# Patient Record
Sex: Male | Born: 1970 | Race: White | Hispanic: No | Marital: Married | Smoking: Former smoker
Health system: Southern US, Community
[De-identification: ages and names within clinical notes are randomized; demographics above are authoritative.]

## PROBLEM LIST (undated history)

## (undated) DIAGNOSIS — M549 Dorsalgia, unspecified: Secondary | ICD-10-CM

## (undated) DIAGNOSIS — G8929 Other chronic pain: Secondary | ICD-10-CM

## (undated) DIAGNOSIS — T7840XA Allergy, unspecified, initial encounter: Secondary | ICD-10-CM

## (undated) DIAGNOSIS — Z8616 Personal history of COVID-19: Secondary | ICD-10-CM

## (undated) DIAGNOSIS — I639 Cerebral infarction, unspecified: Secondary | ICD-10-CM

## (undated) HISTORY — DX: Other chronic pain: G89.29

## (undated) HISTORY — DX: Dorsalgia, unspecified: M54.9

## (undated) HISTORY — DX: Cerebral infarction, unspecified: I63.9

## (undated) HISTORY — DX: Allergy, unspecified, initial encounter: T78.40XA

## (undated) HISTORY — DX: Personal history of COVID-19: Z86.16

## (undated) HISTORY — PX: WISDOM TOOTH EXTRACTION: SHX21

---

## 2009-08-30 ENCOUNTER — Ambulatory Visit: Payer: Self-pay | Admitting: Internal Medicine

## 2009-08-30 DIAGNOSIS — J309 Allergic rhinitis, unspecified: Secondary | ICD-10-CM | POA: Insufficient documentation

## 2009-08-30 DIAGNOSIS — M549 Dorsalgia, unspecified: Secondary | ICD-10-CM | POA: Insufficient documentation

## 2009-09-21 ENCOUNTER — Telehealth: Payer: Self-pay | Admitting: Internal Medicine

## 2010-02-18 ENCOUNTER — Ambulatory Visit
Admission: RE | Admit: 2010-02-18 | Discharge: 2010-02-18 | Payer: Self-pay | Source: Home / Self Care | Attending: Internal Medicine | Admitting: Internal Medicine

## 2010-02-18 ENCOUNTER — Encounter: Payer: Self-pay | Admitting: Internal Medicine

## 2010-02-18 DIAGNOSIS — I781 Nevus, non-neoplastic: Secondary | ICD-10-CM | POA: Insufficient documentation

## 2010-02-21 ENCOUNTER — Telehealth: Payer: Self-pay | Admitting: Internal Medicine

## 2010-03-12 NOTE — Progress Notes (Signed)
Summary: Meloxicam Refill  Phone Note Call from Patient Call back at [phone]   Caller: Patient Call For: D. Thomos Lemons DO Summary of Call: patient called and left voice message stating he spoke with Dr Artist Pais at his office visit about a rx for Meloxicam  for his back pain. He would like to know if he could get  a rx sent to [place] [facility] and [address]. Initial call taken by: Glendell Docker CMA,  September 21, 2009 11:24 AM  Follow-up for Phone Call        attempted to contact patient at [phone], no answer, voice message left informing patient rx sent to pharmacy.  He was advised to call if any questions Follow-up by: Glendell Docker CMA,  September 24, 2009 12:02 PM    New/Updated Medications: MOBIC 15 MG TABS (MELOXICAM) one by mouth once daily as needed for low back pain Prescriptions: MOBIC 15 MG TABS (MELOXICAM) one by mouth once daily as needed for low back pain  #30 x 2   Entered and Authorized by:   D. Thomos Lemons DO   Signed by:   D. Thomos Lemons DO on 09/24/2009   Method used:   Electronically to        [id number] (retail)       [address] [place], [place]  [address]       Ph: [phone]       Fax: [phone]   RxID:   [id number]
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
## 2010-03-14 NOTE — Assessment & Plan Note (Signed)
Summary: MOLE ON SHOULDER /MHF   Vital Signs:  Patient profile:   40 year old male Height:      70.5 inches Weight:      179.75 pounds BMI:     25.52 O2 Sat:      100 % on Room air Temp:     98.0 degrees F oral Pulse rate:   20 / minute Resp:     16 per minute BP sitting:   122 / 80  (right arm) Cuff size:   regular  Vitals Entered By: Glendell Docker CMA (February 18, 2010 10:43 AM)  O2 Flow:  Room air CC: Mole on  Is Patient Diabetic? No Pain Assessment Patient in pain? no      Comments evaluation of on mole on right shoulder, noticed Wednesday of last week   Primary Care Provider:  Dondra Spry DO  CC:  Mole on .  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 One by mouth once daily as needed for low back pain  Other Orders: Specimen Handling (44010)   Orders Added: 1)  Specimen Handling [99000] 2)  Est. Patient Level III [27253] 3)  Shave Skin Lesion < 0.5 cm/trunk/arm/leg [11300]   Immunization  History:  Influenza Immunization History:    Influenza:  historical (10/23/2009)   Immunization History:  Influenza Immunization History:    Influenza:  Historical (10/23/2009)  Current Allergies (reviewed today): No known allergies

## 2010-03-14 NOTE — Progress Notes (Signed)
Summary: pathology results  Phone Note Outgoing Call   Summary of Call: call pt - pathology showed both lesions benign nevus.  plz mail copy of path report to pt Initial call taken by: D. Thomos Lemons DO,  February 21, 2010 5:45 PM  Follow-up for Phone Call        call placed to patient at [phone], no answer. A detailed voice message was left informing patient per Dr Artist Pais instructions. Message was left for patient to call back if any questions. Copy of pathology report mailed to patient Follow-up by: Glendell Docker CMA,  February 22, 2010 2:09 PM

## 2010-03-14 NOTE — Miscellaneous (Signed)
Summary: Consent to Special Procedure  Consent to Special Procedure   Imported By: Lanelle Bal 02/26/2010 09:35:20  _____________________________________________________________________  External Attachment:    Type:   Image     Comment:   External Document

## 2010-08-22 ENCOUNTER — Encounter: Payer: Self-pay | Admitting: Internal Medicine

## 2010-08-29 ENCOUNTER — Ambulatory Visit (INDEPENDENT_AMBULATORY_CARE_PROVIDER_SITE_OTHER): Payer: BC Managed Care – PPO | Admitting: Internal Medicine

## 2010-08-29 ENCOUNTER — Encounter: Payer: Self-pay | Admitting: Internal Medicine

## 2010-08-29 DIAGNOSIS — Z131 Encounter for screening for diabetes mellitus: Secondary | ICD-10-CM

## 2010-08-29 DIAGNOSIS — M542 Cervicalgia: Secondary | ICD-10-CM | POA: Insufficient documentation

## 2010-08-29 DIAGNOSIS — Z13220 Encounter for screening for lipoid disorders: Secondary | ICD-10-CM

## 2010-08-29 LAB — LIPID PANEL
Cholesterol: 164 mg/dL (ref 0–200)
Triglycerides: 46 mg/dL (ref <150)

## 2010-08-29 NOTE — Assessment & Plan Note (Signed)
Improved. Continue conservative measures

## 2010-08-29 NOTE — Progress Notes (Signed)
  Subjective:    Patient ID: Chanson Teems, male    DOB: Dec 23, 1970, 40 y.o.   MRN: [id number]  HPI Pt presents to clinic for followup of neck pain. H/o chronic intermitent neck pain now improved with posture change and massage therapy. Denies radicular pain, paresthesias or weakness. Also needs lipid and glucose for work form. No other complaints.   Reviewed pmh, medications and allergies    Review of Systems  Musculoskeletal: Positive for arthralgias. Negative for back pain.       Objective:   Physical Exam  Nursing note and vitals reviewed. Constitutional: He appears well-developed and well-nourished. No distress.  HENT:  Head: Normocephalic and atraumatic.  Right Ear: External ear normal.  Left Ear: External ear normal.  Eyes: Conjunctivae are normal. No scleral icterus.  Neck: Neck supple. Carotid bruit is not present.  Cardiovascular: Normal rate and normal heart sounds.  Exam reveals no gallop and no friction rub.   No murmur heard. Pulmonary/Chest: Effort normal and breath sounds normal. No respiratory distress. He has no wheezes. He has no rales.  Neurological: He is alert.  Skin: Skin is warm and dry. He is not diaphoretic.  Psychiatric: He has a normal mood and affect.          Assessment & Plan:

## 2019-12-10 ENCOUNTER — Emergency Department (HOSPITAL_COMMUNITY): Payer: No Typology Code available for payment source

## 2019-12-10 ENCOUNTER — Other Ambulatory Visit: Payer: Self-pay

## 2019-12-10 ENCOUNTER — Encounter (HOSPITAL_COMMUNITY): Payer: Self-pay | Admitting: Emergency Medicine

## 2019-12-10 ENCOUNTER — Emergency Department (HOSPITAL_COMMUNITY)
Admission: EM | Admit: 2019-12-10 | Discharge: 2019-12-10 | Disposition: A | Discharge status: Home / Self Care | Payer: No Typology Code available for payment source | Attending: Emergency Medicine | Admitting: Emergency Medicine

## 2019-12-10 DIAGNOSIS — I629 Nontraumatic intracranial hemorrhage, unspecified: Secondary | ICD-10-CM | POA: Diagnosis not present

## 2019-12-10 DIAGNOSIS — R42 Dizziness and giddiness: Secondary | ICD-10-CM | POA: Insufficient documentation

## 2019-12-10 DIAGNOSIS — Z87891 Personal history of nicotine dependence: Secondary | ICD-10-CM | POA: Diagnosis not present

## 2019-12-10 DIAGNOSIS — D1800 Hemangioma unspecified site: Secondary | ICD-10-CM

## 2019-12-10 LAB — DIFFERENTIAL
Abs Immature Granulocytes: 0.04 10*3/uL (ref 0.00–0.07)
Basophils Absolute: 0 10*3/uL (ref 0.0–0.1)
Basophils Relative: 0 %
Eosinophils Absolute: 0 10*3/uL (ref 0.0–0.5)
Eosinophils Relative: 0 %
Immature Granulocytes: 0 %
Lymphocytes Relative: 6 %
Lymphs Abs: 0.6 10*3/uL — ABNORMAL LOW (ref 0.7–4.0)
Monocytes Absolute: 0.3 10*3/uL (ref 0.1–1.0)
Monocytes Relative: 3 %
Neutro Abs: 9.3 10*3/uL — ABNORMAL HIGH (ref 1.7–7.7)
Neutrophils Relative %: 91 %

## 2019-12-10 LAB — COMPREHENSIVE METABOLIC PANEL
ALT: 24 U/L (ref 0–44)
AST: 26 U/L (ref 15–41)
Albumin: 4.5 g/dL (ref 3.5–5.0)
Alkaline Phosphatase: 58 U/L (ref 38–126)
Anion gap: 13 (ref 5–15)
BUN: 14 mg/dL (ref 6–20)
CO2: 24 mmol/L (ref 22–32)
Calcium: 9.5 mg/dL (ref 8.9–10.3)
Chloride: 100 mmol/L (ref 98–111)
Creatinine, Ser: 0.81 mg/dL (ref 0.61–1.24)
GFR, Estimated: >60 mL/min (ref >60)
Glucose, Bld: 145 mg/dL — ABNORMAL HIGH (ref 70–99)
Potassium: 3.7 mmol/L (ref 3.5–5.1)
Sodium: 137 mmol/L (ref 135–145)
Total Bilirubin: 1 mg/dL (ref 0.3–1.2)
Total Protein: 7.4 g/dL (ref 6.5–8.1)

## 2019-12-10 LAB — PROTIME-INR
INR: 1.1 (ref 0.8–1.2)
Prothrombin Time: 14.2 seconds (ref 11.4–15.2)

## 2019-12-10 LAB — APTT: aPTT: 31 seconds (ref 24–36)

## 2019-12-10 LAB — CBC
HCT: 46.9 % (ref 39.0–52.0)
Hemoglobin: 16.1 g/dL (ref 13.0–17.0)
MCH: 28.5 pg (ref 26.0–34.0)
MCHC: 34.3 g/dL (ref 30.0–36.0)
MCV: 83.2 fL (ref 80.0–100.0)
Platelets: 248 10*3/uL (ref 150–400)
RBC: 5.64 MIL/uL (ref 4.22–5.81)
RDW: 13.2 % (ref 11.5–15.5)
WBC: 10.2 10*3/uL (ref 4.0–10.5)
nRBC: 0 % (ref 0.0–0.2)

## 2019-12-10 IMAGING — MR MR CERVICAL SPINE WO/W CM
6 of 8 series · 28 of 48 positions shown · IV contrast (gadavist)
Comparison: [DATE] head CT.

CLINICAL DATA: Abnormal CT finding.

Dizziness, nonspecific.
EXAM:
MRI HEAD WITHOUT AND WITH CONTRAST
MRI CERVICAL SPINE WITHOUT AND WITH CONTRAST
TECHNIQUE: Multiplanar, multiecho pulse sequences of the brain and surrounding
structures, and cervical spine, to include the craniocervical
junction and cervicothoracic junction, were obtained without and
with intravenous contrast.
CONTRAST:  8mL GADAVIST GADOBUTROL 1 MMOL/ML IV SOLN

[Series 22: T2 · sagittal · 3.0mm · 0.69mm/px · 3 of 15 slices shown (1 of 2)]
[im 1/15]
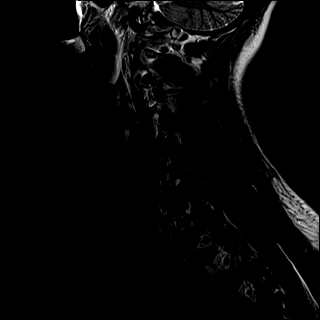
[im 8/15]
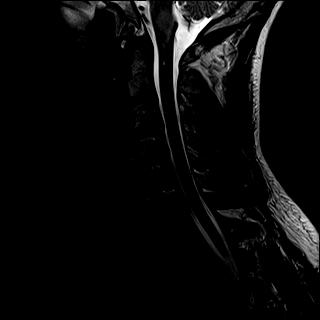
[im 15/15]
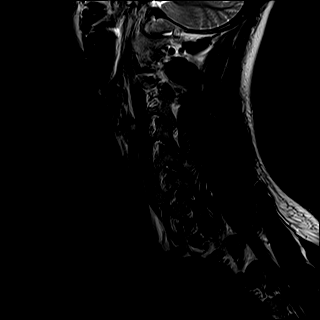

[Series 23: T1 · sagittal · 3.0mm · 0.69mm/px · 3 of 15 slices shown (1 of 2)]
[im 1/15]
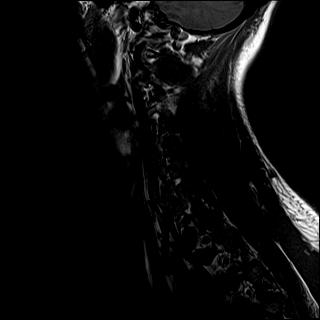
[im 8/15]
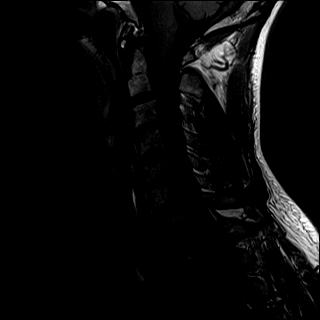
[im 15/15]
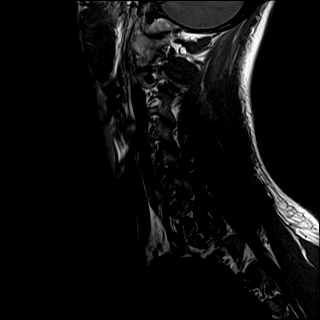

[Series 24: STIR · sagittal · 3.0mm · 0.86mm/px · 1 of 15 slices shown]
[im 1/15]
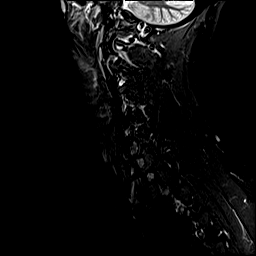

[Series 25: T2 · axial · 3.0mm · 0.66mm/px · z∈[-256,-141]mm · 9 of 37 slices shown (2 of 2)]
[im 1/37]
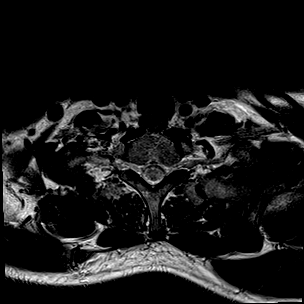
[im 5/37]
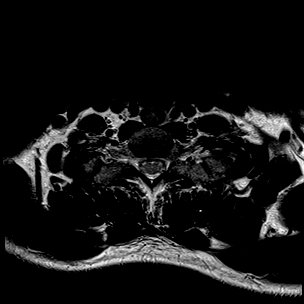
[im 10/37]
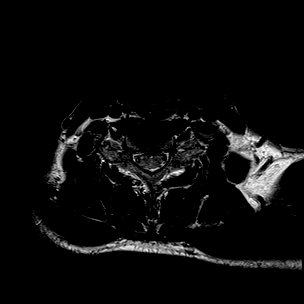
[im 14/37]
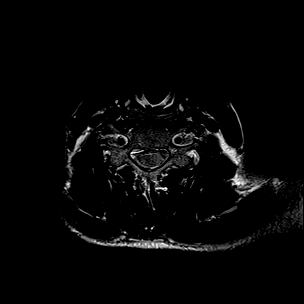
[im 19/37]
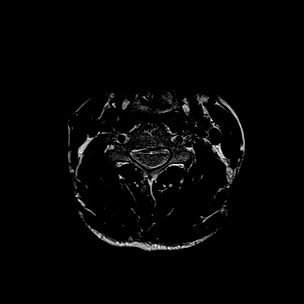
[im 23/37]
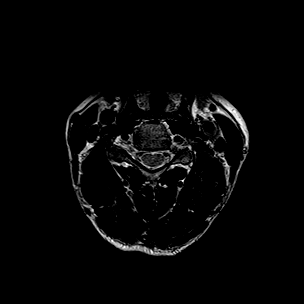
[im 28/37]
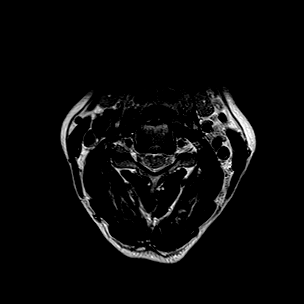
[im 32/37]
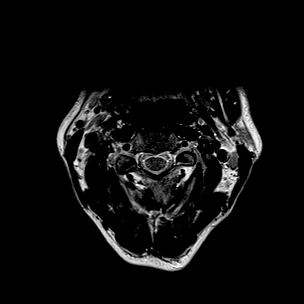
[im 37/37]
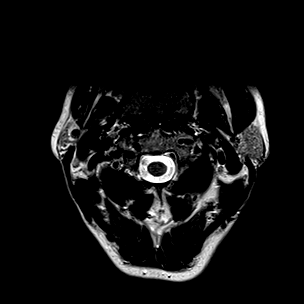

[Series 27: T1 · axial · 3.0mm · 0.39mm/px · z∈[-256,-141]mm · 9 of 37 slices shown (2 of 2)]
[im 1/37]
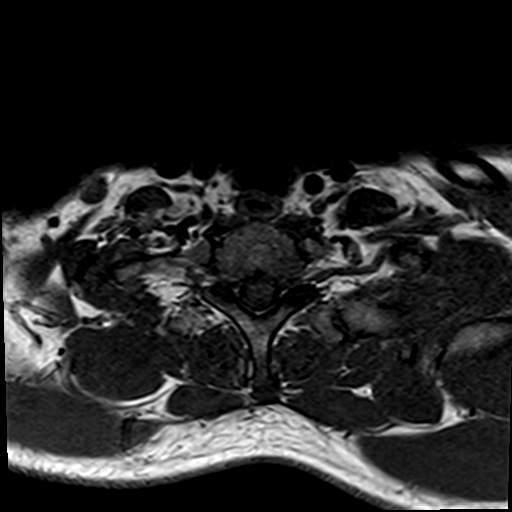
[im 5/37]
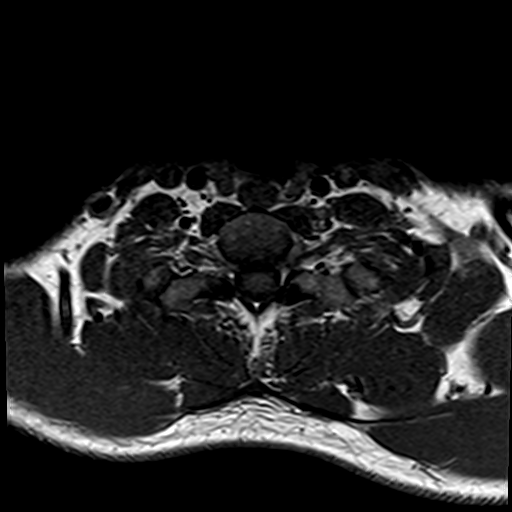
[im 10/37]
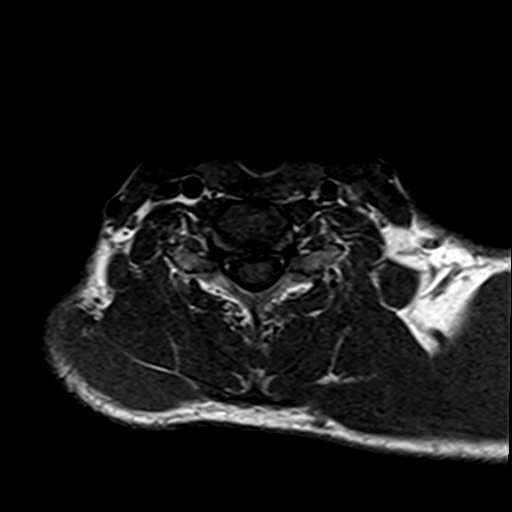
[im 14/37]
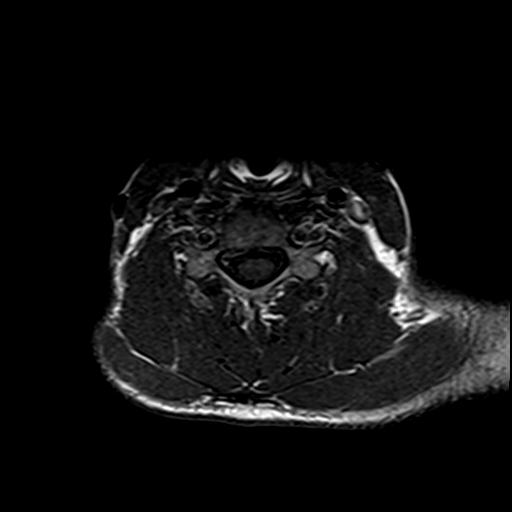
[im 19/37]
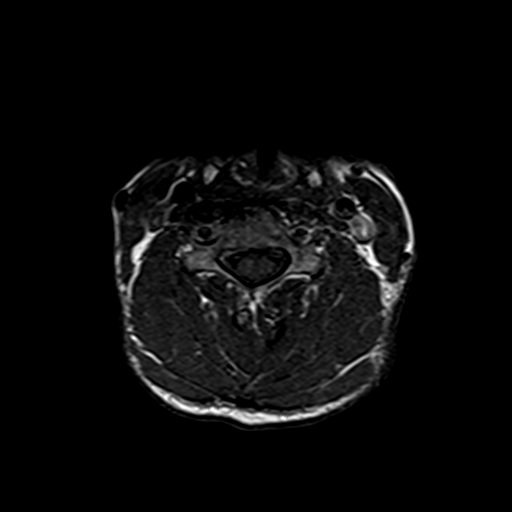
[im 23/37]
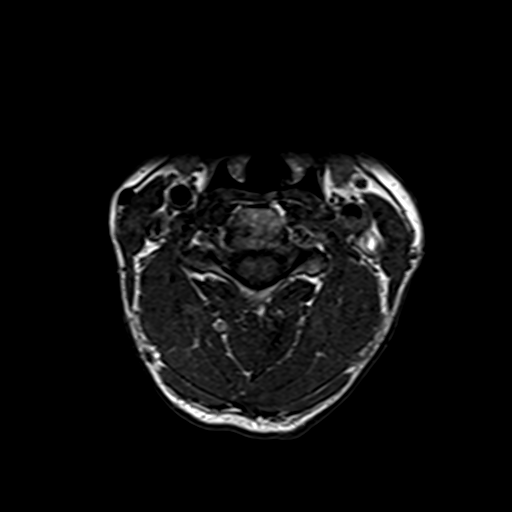
[im 28/37]
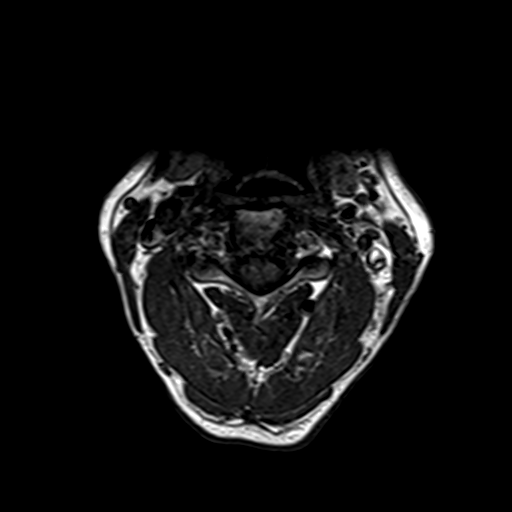
[im 32/37]
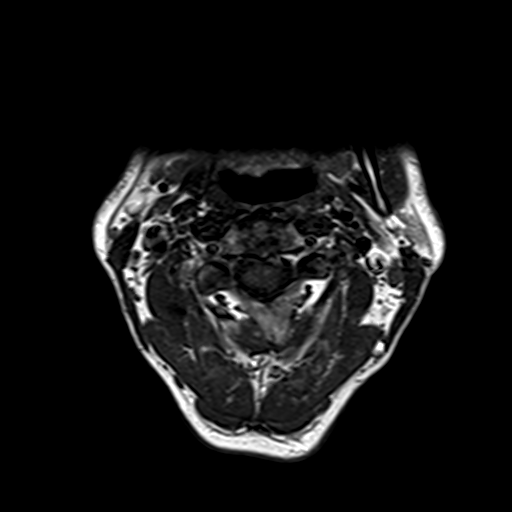
[im 37/37]
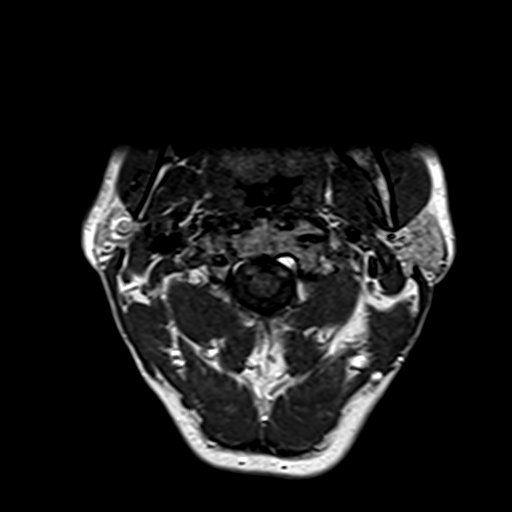

[Series 28: T1 fat-sat post-contrast · sagittal · 3.0mm · 0.43mm/px · 3 of 15 slices shown]
[im 1/15]
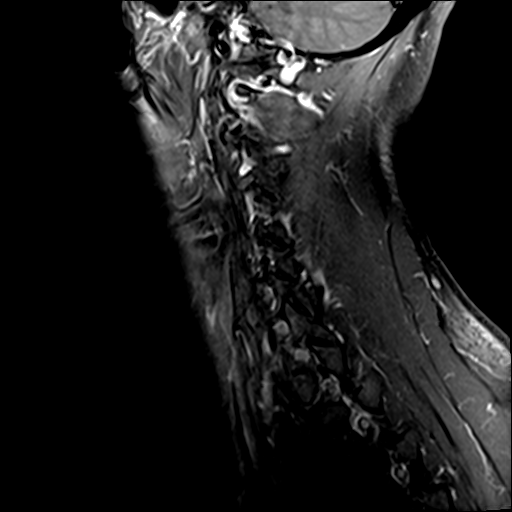
[im 8/15]
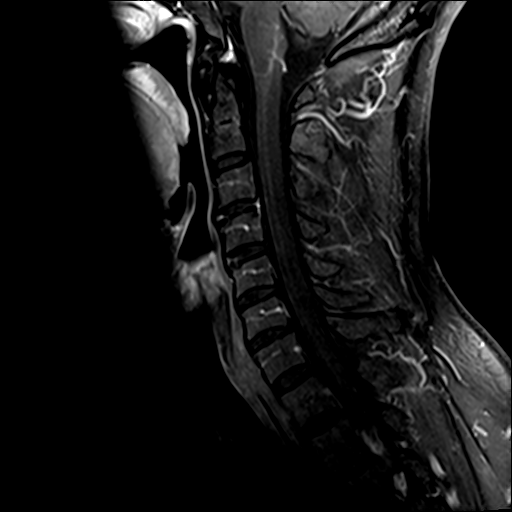
[im 15/15]
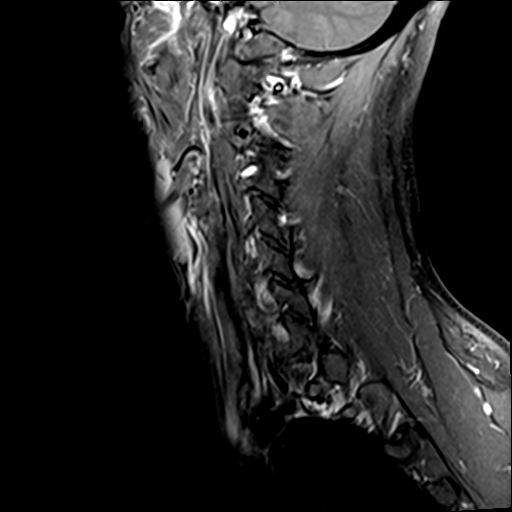

[28 of 48 positions shown; findings below may reference images not displayed]

FINDINGS: MRI HEAD FINDINGS

Brain: No focal restricted diffusion. No acute intracranial
hemorrhage. No midline shift, ventriculomegaly or extra-axial fluid
collection. No abnormal enhancement.

Nonenhancing 7 mm right medullary lesion with T2 hypointense rim
([DATE], [DATE]) and thin peripheral edema. There is associated SWI
signal dropout. Small associated developmental venous anomaly likely
within the left medulla/upper cervical cord ([DATE]).

Vascular: Proximally preserved major intracranial flow voids.

Skull and upper cervical spine: Normal marrow signal.

Sinuses/Orbits: Normal orbits. Minimal ethmoid and left maxillary
sinus mucosal thickening. Trace left mastoid effusion.

Other: None.

MRI CERVICAL SPINE FINDINGS

Alignment: Normal.

Vertebrae: Normal bone marrow signal intensity. No focal osseous
lesion.

Cord: Please see MRI head for evaluation of right medullary
cavernoma. Associated left upper cervical cord developmental venous
anomaly ([DATE]). Cord morphology and signal intensity is otherwise
normal.

Posterior Fossa, vertebral arteries: Please see MRI head.

Disc levels: Multilevel desiccation however disc spaces are grossly
preserved.

C2-3: Left uncovertebral and facet hypertrophy with mild left neural
foraminal narrowing. Patent spinal canal and right neural foramen.

C3-4: Small disc osteophyte complex with superimposed left
subarticular protrusion. Uncovertebral and facet hypertrophy. Mild
left neural foraminal narrowing. Patent spinal canal and right
neural foramen.

C4-5: Uncovertebral and facet degenerative spurring. Patent spinal
canal and neural foramen.

C5-6: Small disc osteophyte complex with superimposed bilateral
subarticular protrusions partially effacing the ventral CSF
containing spaces. Uncovertebral and facet degenerative spurring.
Patent spinal canal. Mild bilateral neural foraminal narrowing.

C6-7: Disc osteophyte complex partially effacing the ventral CSF
containing spaces. Small left foraminal protrusion. Uncovertebral
and facet hypertrophy. Minimal to mild spinal canal narrowing.
Moderate bilateral neural foraminal narrowing.

C7-T1: Uncovertebral and facet degenerative spurring. Patent spinal
canal and neural foramen.

Paraspinal tissues: Negative.
IMPRESSION: 7 mm right medullary cavernoma with small associated left
medulla/upper cervical cord developmental venous anomaly.

No acute intracranial hemorrhage or infarct.

Multilevel spondylosis as detailed above.

## 2019-12-10 IMAGING — CT CT HEAD W/O CM
4 series · 16 of 47 positions shown, 18 images · non-contrast
Comparison: None.

CLINICAL DATA: Dizziness.

EXAM:
CT HEAD WITHOUT CONTRAST
TECHNIQUE: Contiguous axial images were obtained from the base of the skull
through the vertex without intravenous contrast.

[Series 3: head wo · axial · 0.47mm/px · z∈[-135,-15]mm · 7 of 34 slices shown, 9 images]
[im 5/34  brain]
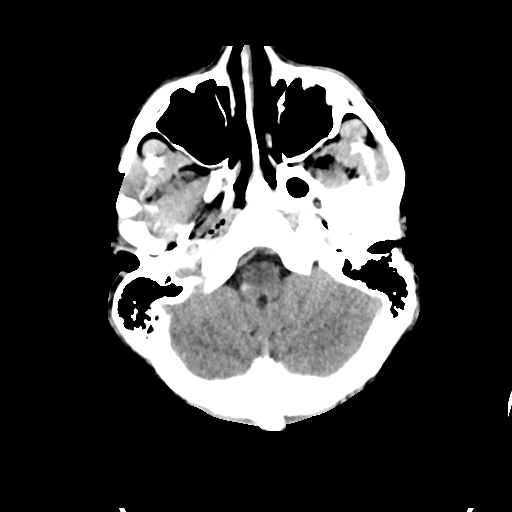
[im 5/34  bone]
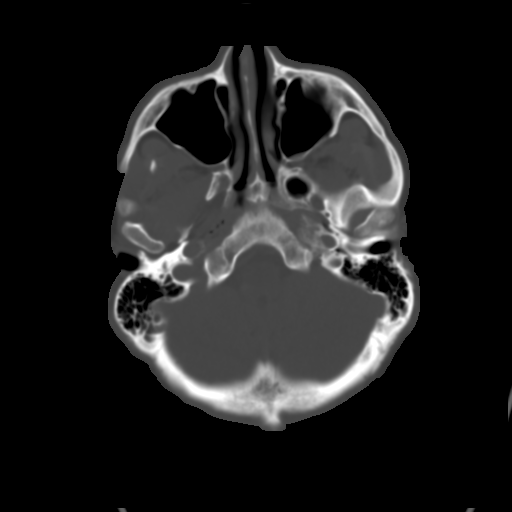
[im 9/34  brain]
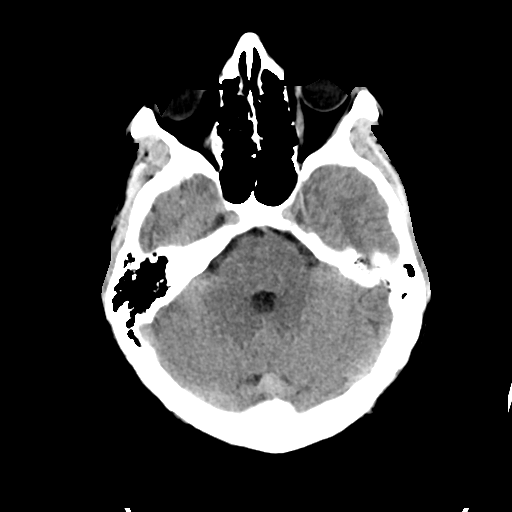
[im 13/34  brain]
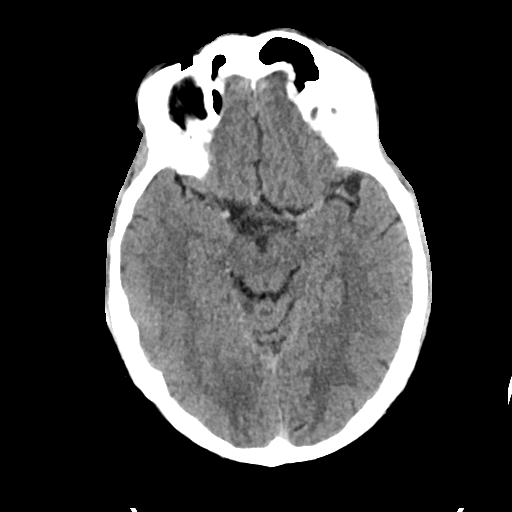
[im 17/34  brain]
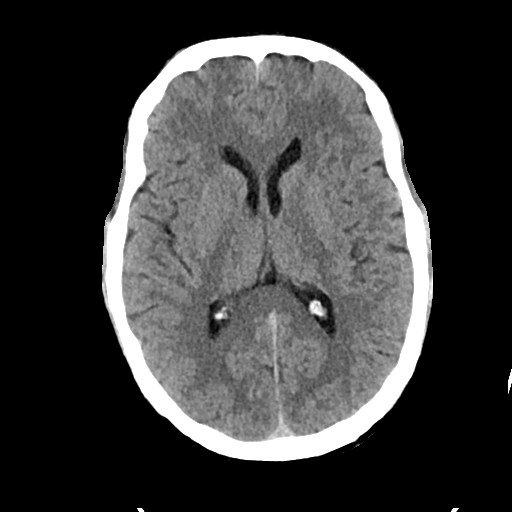
[im 21/34  brain]
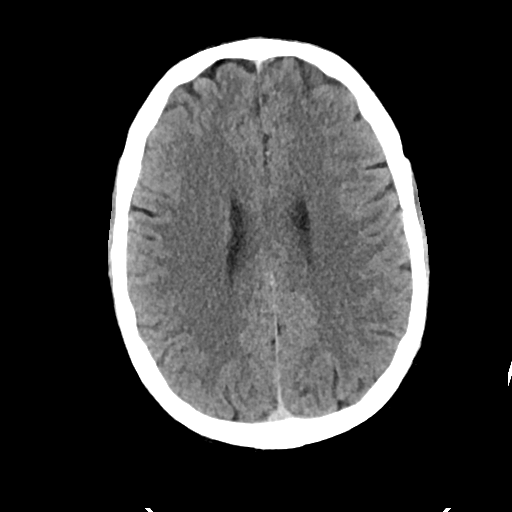
[im 21/34  bone]
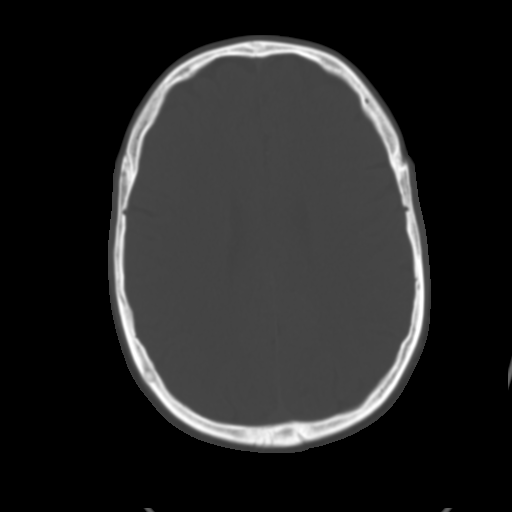
[im 25/34  brain]
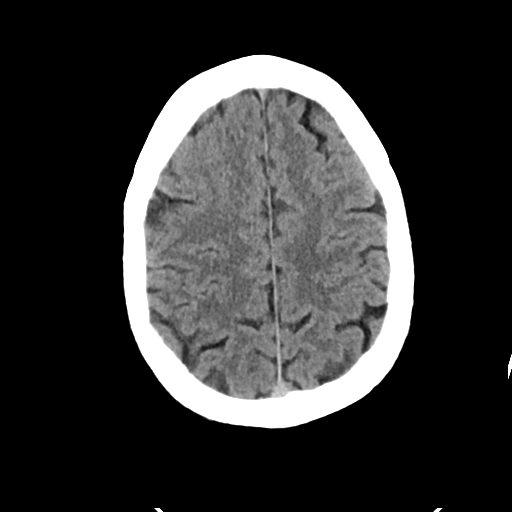
[im 29/34  brain]
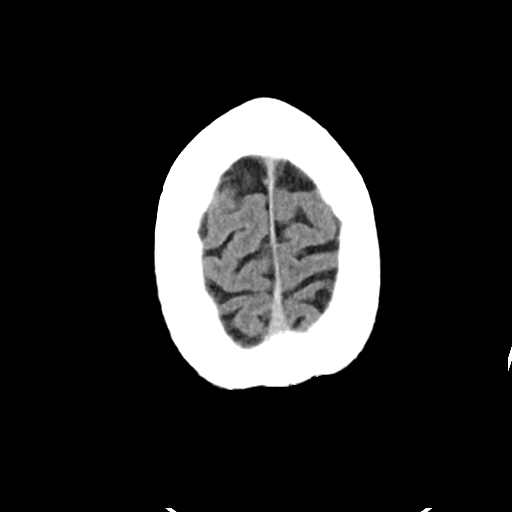

[Series 4: head bone · axial · 0.47mm/px · z∈[-139,-107]mm · 3 of 83 slices shown]
[im 9/83  bone]
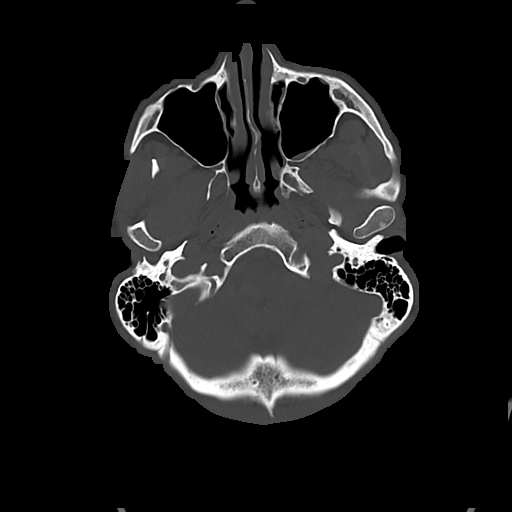
[im 17/83  bone]
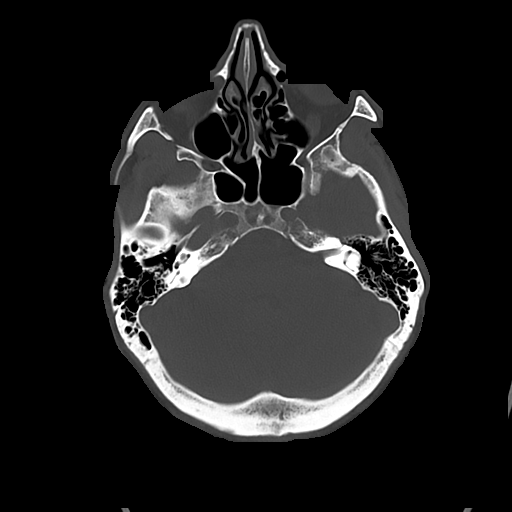
[im 25/83  bone]
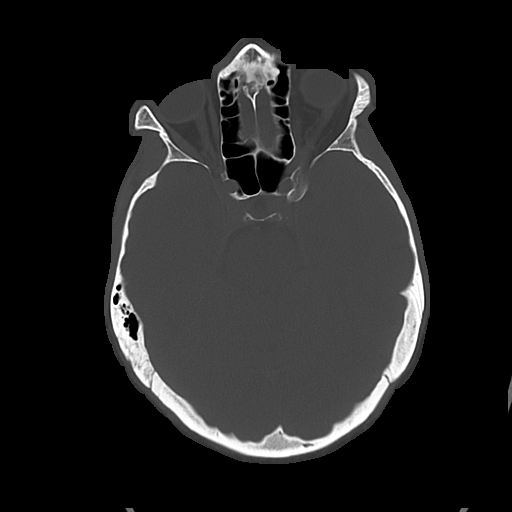

[Series 5: cor soft · coronal · 0.38mm/px · 3 of 72 slices shown]
[im 24/72  brain]
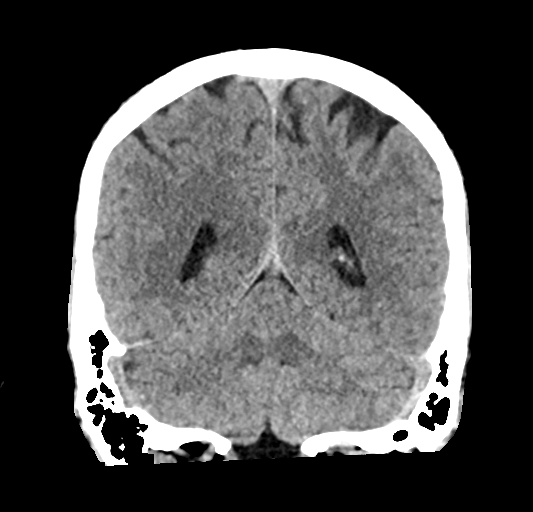
[im 32/72  brain]
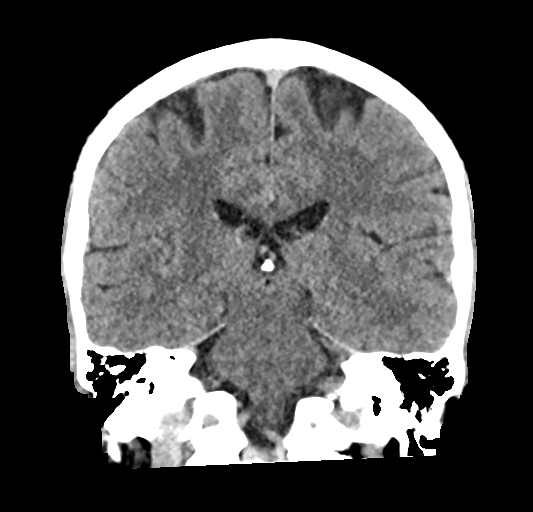
[im 40/72  brain]
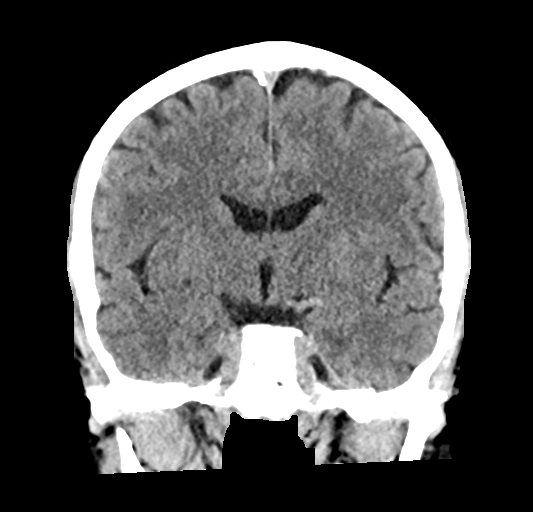

[Series 6: sag soft · sagittal · 0.36mm/px · 3 of 57 slices shown]
[im 19/57  brain]
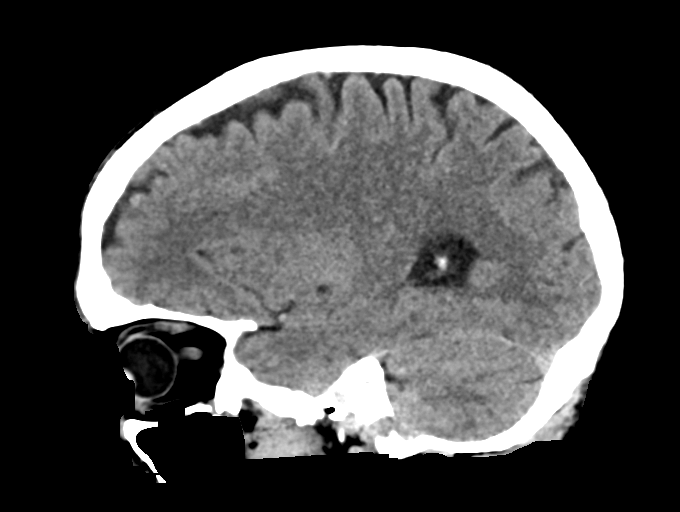
[im 29/57  brain]
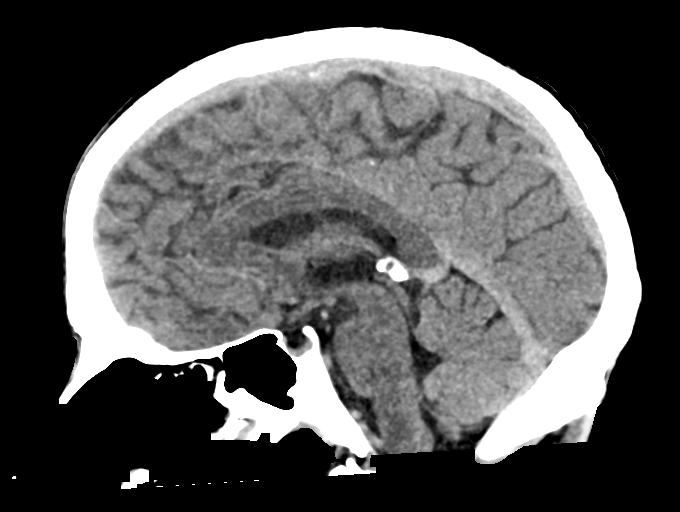
[im 38/57  brain]
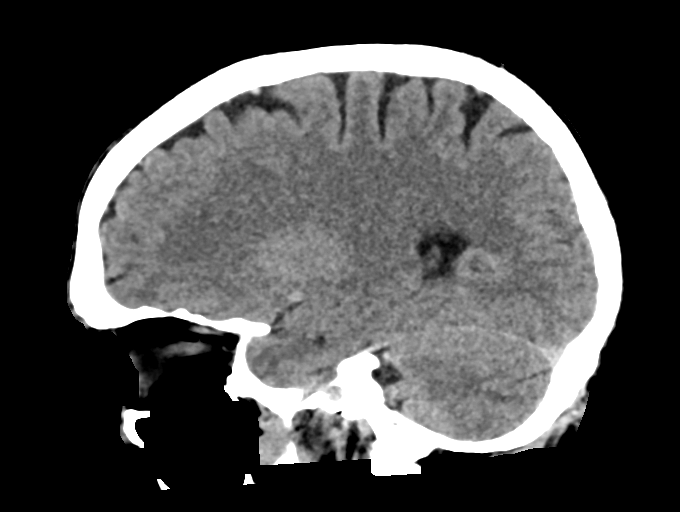

[16 of 47 positions shown; findings below may reference images not displayed]

FINDINGS: Brain: No evidence of acute infarction, hemorrhage, hydrocephalus,
extra-axial collection or mass lesion/mass effect within the brain.
There is a hypervascular mass versus vascular malformation in the
right hand side aspect of the junction of the medulla and proximal
spinal cord. The abnormality measures 1.7 x 0.9 x 0.8 cm. There is a
hypoattenuated appearance of the adjacent parenchyma, which may
represent edema.

Vascular: No hyperdense vessel or unexpected calcification.

Skull: Normal. Negative for fracture or focal lesion.

Sinuses/Orbits: No acute finding.

Other: None.
IMPRESSION: Hypervascular mass versus vascular malformation in the right hand
side aspect of the junction of the medulla and proximal spinal cord.
The abnormality measures 1.7 x 0.9 x 0.8 cm. There is a
hypoattenuated appearance of the adjacent neural parenchyma, which
may represent edema.

Further evaluation with MRI of the brain and cervical spine with
contrast is recommended.

These results were called by telephone at the time of interpretation
on [DATE] at [DATE] to Dr. QUIRIJN , who verbally
acknowledged these results.

## 2019-12-10 IMAGING — MR MR HEAD WO/W CM
14 of 16 series · 40 of 48 positions shown · IV contrast (gadavist)
Comparison: [DATE] head CT.

CLINICAL DATA: Abnormal CT finding.

Dizziness, nonspecific.
EXAM:
MRI HEAD WITHOUT AND WITH CONTRAST
MRI CERVICAL SPINE WITHOUT AND WITH CONTRAST
TECHNIQUE: Multiplanar, multiecho pulse sequences of the brain and surrounding
structures, and cervical spine, to include the craniocervical
junction and cervicothoracic junction, were obtained without and
with intravenous contrast.
CONTRAST:  8mL GADAVIST GADOBUTROL 1 MMOL/ML IV SOLN

[Series 5: DWI · axial · 3.0mm · 0.88mm/px · z∈[-70,+76]mm · 5 of 100 slices shown (1 of 4)]
[im 1/100]
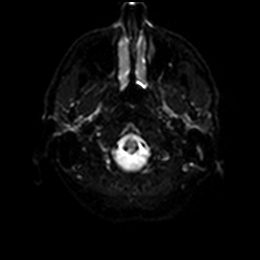
[im 25/100]
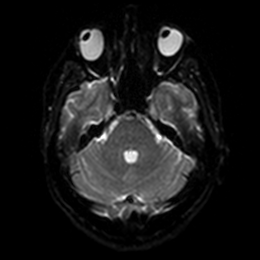
[im 50/100]
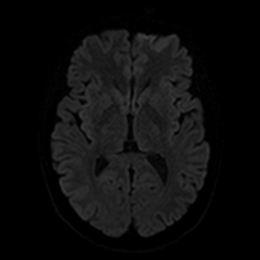
[im 75/100]
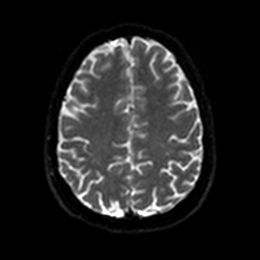
[im 100/100]
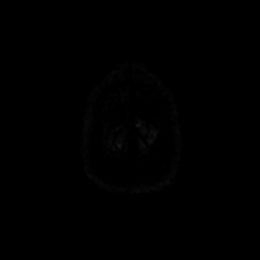

[Series 6: DWI · axial · 3.0mm · 0.88mm/px · z∈[-70,+76]mm · 2 of 50 slices shown (2 of 4)]
[im 1/50]
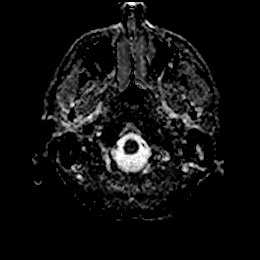
[im 50/50]
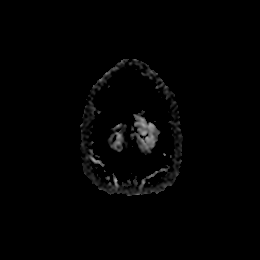

[Series 7: DWI · coronal · 4.0mm · 0.88mm/px · 4 of 70 slices shown (3 of 4)]
[im 1/70]
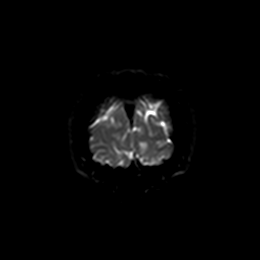
[im 24/70]
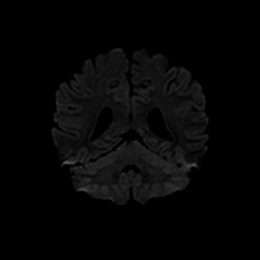
[im 47/70]
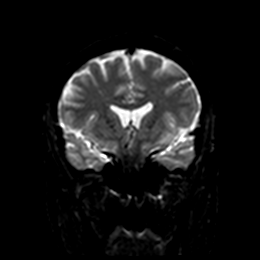
[im 70/70]
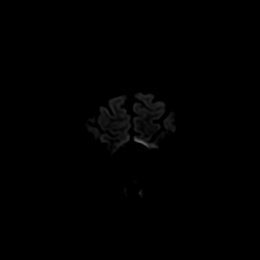

[Series 8: DWI · coronal · 4.0mm · 0.88mm/px · 2 of 35 slices shown (4 of 4)]
[im 1/35]
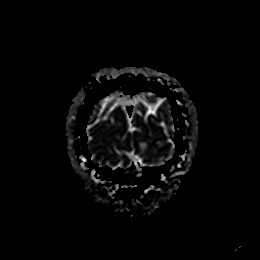
[im 35/35]
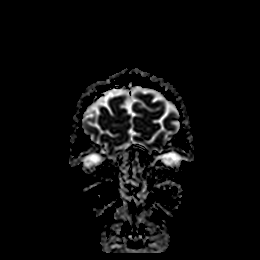

[Series 9: T1 · sagittal · 5.0mm · 0.75mm/px · 2 of 25 slices shown]
[im 1/25]
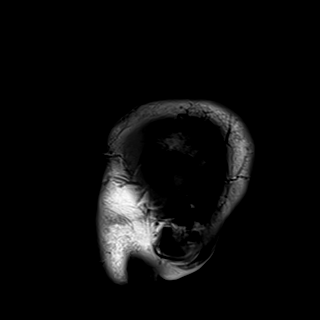
[im 25/25]
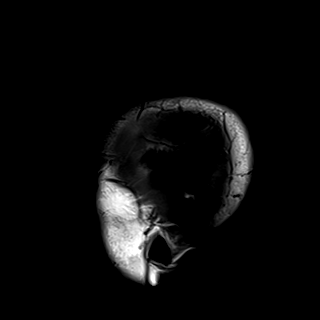

[Series 10: T2 · axial · 5.0mm · 0.72mm/px · z∈[-74,+81]mm · 2 of 27 slices shown (1 of 2)]
[im 1/27]
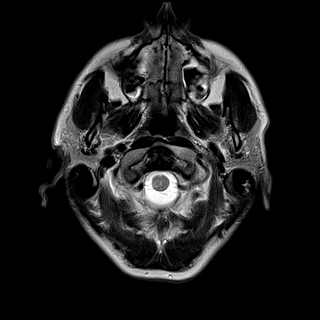
[im 27/27]
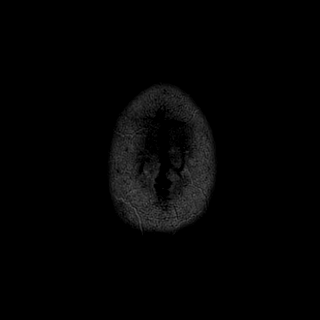

[Series 11: FLAIR · axial · 5.0mm · 0.45mm/px · z∈[-76,+79]mm · 2 of 27 slices shown]
[im 1/27]
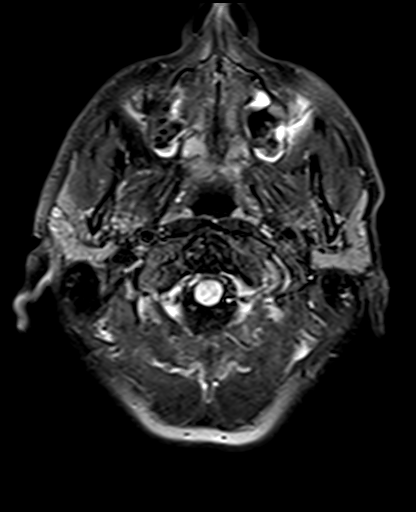
[im 27/27]
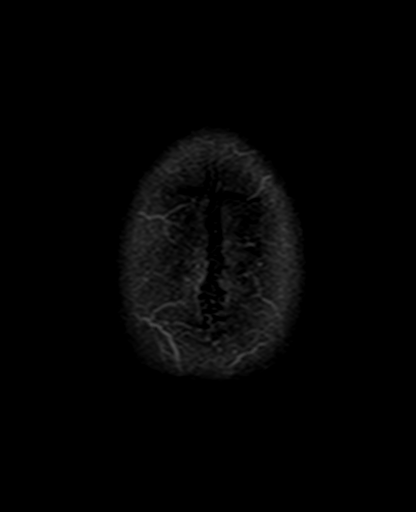

[Series 12: mag_images · axial · 3.0mm · 0.90mm/px · z∈[-82,+94]mm · 4 of 60 slices shown]
[im 1/60]
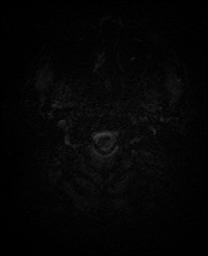
[im 20/60]
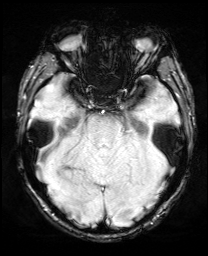
[im 40/60]
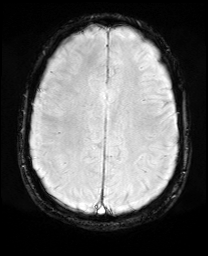
[im 60/60]
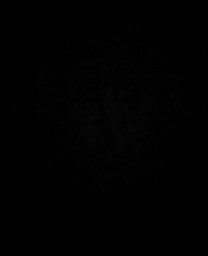

[Series 13: pha_images · axial · 3.0mm · 0.90mm/px · z∈[-82,+88]mm · 4 of 57 slices shown]
[im 1/57]
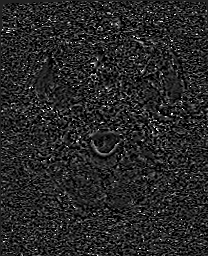
[im 19/57]
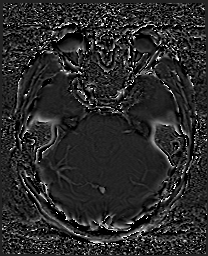
[im 38/57]
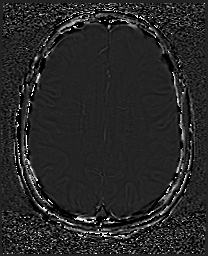
[im 57/57]
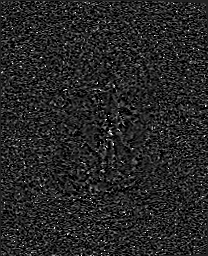

[Series 14: swi_images · axial · 3.0mm · 0.90mm/px · z∈[-82,+94]mm · 4 of 60 slices shown]
[im 1/60]
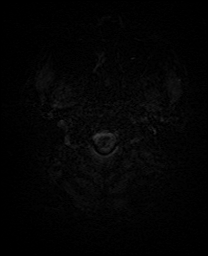
[im 20/60]
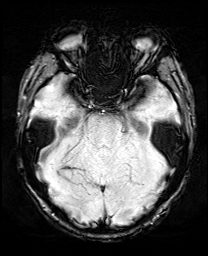
[im 40/60]
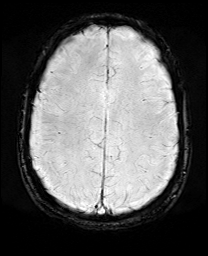
[im 60/60]
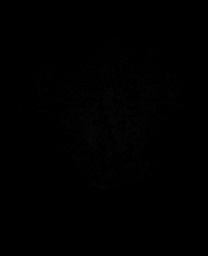

[Series 15: mip_images(sw) · axial · 24.0mm · 0.90mm/px · z∈[-72,+83]mm · 3 of 53 slices shown]
[im 1/53]
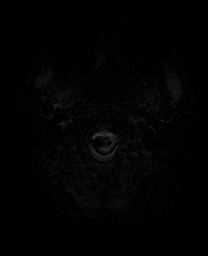
[im 27/53]
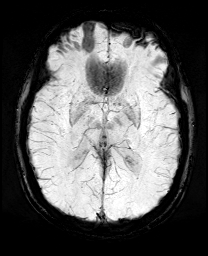
[im 53/53]
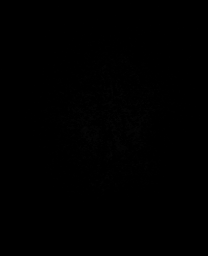

[Series 17: T2 · coronal · 5.0mm · 0.34mm/px · 2 of 31 slices shown (2 of 2)]
[im 1/31]
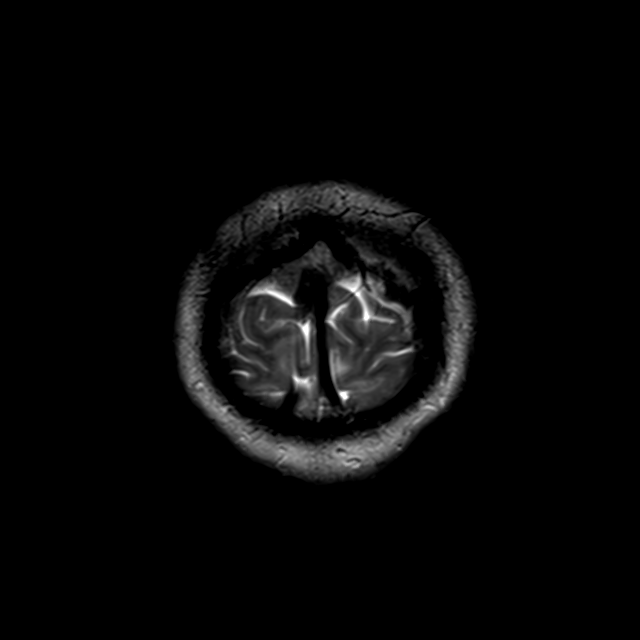
[im 31/31]
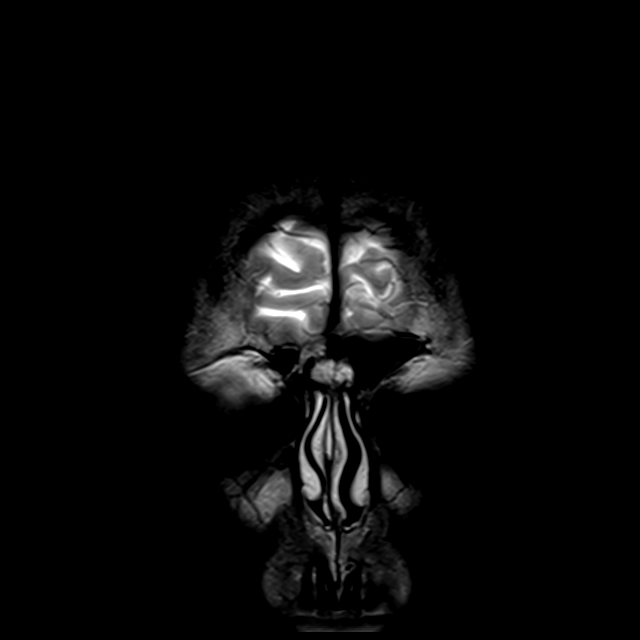

[Series 31: T1 post-contrast · coronal · 5.0mm · 0.34mm/px · 2 of 31 slices shown (1 of 2)]
[im 1/31]
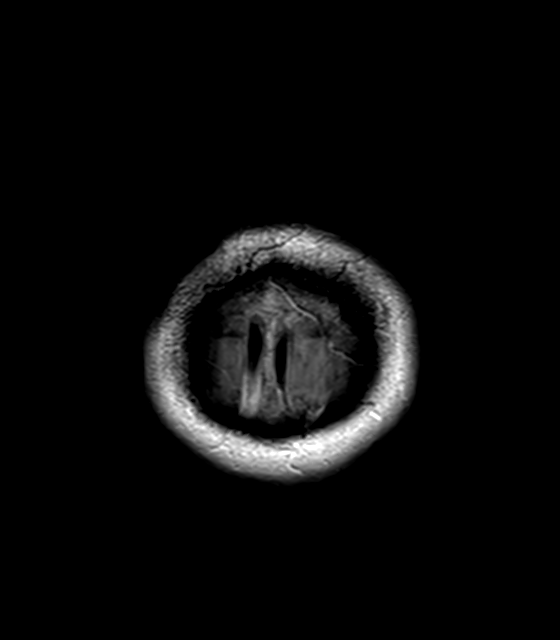
[im 31/31]
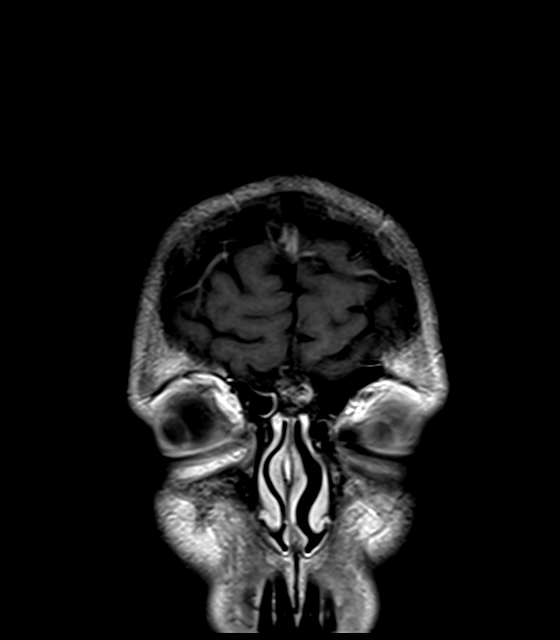

[Series 32: T1 post-contrast · sagittal · 5.0mm · 0.72mm/px · 2 of 25 slices shown (2 of 2)]
[im 1/25]
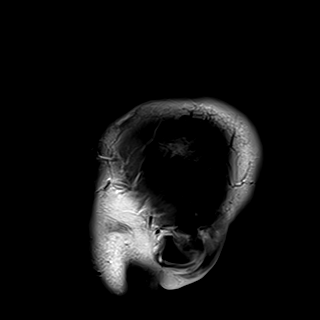
[im 25/25]
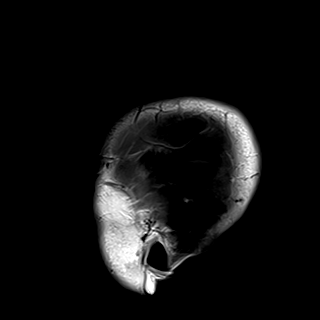

[40 of 48 positions shown; findings below may reference images not displayed]

FINDINGS: MRI HEAD FINDINGS

Brain: No focal restricted diffusion. No acute intracranial
hemorrhage. No midline shift, ventriculomegaly or extra-axial fluid
collection. No abnormal enhancement.

Nonenhancing 7 mm right medullary lesion with T2 hypointense rim
([DATE], [DATE]) and thin peripheral edema. There is associated SWI
signal dropout. Small associated developmental venous anomaly likely
within the left medulla/upper cervical cord ([DATE]).

Vascular: Proximally preserved major intracranial flow voids.

Skull and upper cervical spine: Normal marrow signal.

Sinuses/Orbits: Normal orbits. Minimal ethmoid and left maxillary
sinus mucosal thickening. Trace left mastoid effusion.

Other: None.

MRI CERVICAL SPINE FINDINGS

Alignment: Normal.

Vertebrae: Normal bone marrow signal intensity. No focal osseous
lesion.

Cord: Please see MRI head for evaluation of right medullary
cavernoma. Associated left upper cervical cord developmental venous
anomaly ([DATE]). Cord morphology and signal intensity is otherwise
normal.

Posterior Fossa, vertebral arteries: Please see MRI head.

Disc levels: Multilevel desiccation however disc spaces are grossly
preserved.

C2-3: Left uncovertebral and facet hypertrophy with mild left neural
foraminal narrowing. Patent spinal canal and right neural foramen.

C3-4: Small disc osteophyte complex with superimposed left
subarticular protrusion. Uncovertebral and facet hypertrophy. Mild
left neural foraminal narrowing. Patent spinal canal and right
neural foramen.

C4-5: Uncovertebral and facet degenerative spurring. Patent spinal
canal and neural foramen.

C5-6: Small disc osteophyte complex with superimposed bilateral
subarticular protrusions partially effacing the ventral CSF
containing spaces. Uncovertebral and facet degenerative spurring.
Patent spinal canal. Mild bilateral neural foraminal narrowing.

C6-7: Disc osteophyte complex partially effacing the ventral CSF
containing spaces. Small left foraminal protrusion. Uncovertebral
and facet hypertrophy. Minimal to mild spinal canal narrowing.
Moderate bilateral neural foraminal narrowing.

C7-T1: Uncovertebral and facet degenerative spurring. Patent spinal
canal and neural foramen.

Paraspinal tissues: Negative.
IMPRESSION: 7 mm right medullary cavernoma with small associated left
medulla/upper cervical cord developmental venous anomaly.

No acute intracranial hemorrhage or infarct.

Multilevel spondylosis as detailed above.

## 2019-12-10 MED ORDER — ONDANSETRON HCL 4 MG/2ML IJ SOLN
4.0000 mg | Freq: Once | INTRAMUSCULAR | Status: AC
  Administered 2019-12-10: 4 mg via INTRAVENOUS
  Filled 2019-12-10: qty 2

## 2019-12-10 MED ORDER — PREDNISONE 10 MG PO TABS: 40.0000 mg | ORAL_TABLET | Freq: Every day | ORAL | 0 refills | Status: AC

## 2019-12-10 MED ORDER — SODIUM CHLORIDE 0.9% FLUSH
3.0000 mL | Freq: Once | INTRAVENOUS | Status: AC
  Administered 2019-12-10: 3 mL via INTRAVENOUS

## 2019-12-10 MED ORDER — MECLIZINE HCL 25 MG PO TABS
25.0000 mg | ORAL_TABLET | Freq: Once | ORAL | Status: AC
  Administered 2019-12-10: 25 mg via ORAL
  Filled 2019-12-10: qty 1

## 2019-12-10 MED ORDER — GADOBUTROL 1 MMOL/ML IV SOLN
8.0000 mL | Freq: Once | INTRAVENOUS | Status: AC | PRN
  Administered 2019-12-10: 8 mL via INTRAVENOUS

## 2019-12-10 MED ORDER — SODIUM CHLORIDE 0.9 % IV BOLUS
1000.0000 mL | Freq: Once | INTRAVENOUS | Status: AC
  Administered 2019-12-10: 1000 mL via INTRAVENOUS

## 2019-12-10 MED ORDER — MECLIZINE HCL 25 MG PO TABS: 25.0000 mg | ORAL_TABLET | Freq: Three times a day (TID) | ORAL | 0 refills | Status: AC | PRN

## 2019-12-10 NOTE — ED Provider Notes (Signed)
[facility] EMERGENCY DEPARTMENT Provider Note   CSN: [id number] Arrival date & time: 12/10/19  1133     History Chief Complaint  Patient presents with  . Dizziness  . Emesis    Anthony Beasley is a 49 y.o. male presenting to the emergency department with complaint of sudden onset of room spinning dizziness that began around 11 PM last night.  He has associated nausea and vomiting.  He states he had trouble sleeping last night because anytime he turned his head his dizziness worsened.  His symptoms do not feel much better with rest.  He has a mild headache last night that that is improved today.  He denies vision changes, unilateral numbness or weakness though does note some tingling sensation in his right hand.  No other significant medical problems.  The history is provided by the patient.       Past Medical History:  Diagnosis Date  . Allergy    rhinitis  . Chronic back pain     Patient Active Problem List   Diagnosis Date Noted  . Neck pain 08/29/2010  . DYSPLASTIC NEVUS, BACK 02/18/2010  . ALLERGIC RHINITIS 08/30/2009  . BACK PAIN, CHRONIC 08/30/2009    Past Surgical History:  Procedure Laterality Date  . WISDOM TOOTH EXTRACTION         Family History  Problem Relation Age of Onset  . Hypothyroidism Mother   . Cancer Paternal Grandfather        skin  . Hyperlipidemia Other     Social History   Tobacco Use  . Smoking status: Former Smoker    Packs/day: 0.50    Types: Cigarettes  Substance Use Topics  . Alcohol use: Yes    Alcohol/week: 1.0 standard drink    Types: 1 Cans of beer per week  . Drug use: Yes    Types: Marijuana    Home Medications Prior to Admission medications   Medication Sig Start Date End Date Taking? Authorizing Provider  ibuprofen (ADVIL) 200 MG tablet Take 400 mg by mouth every 6 (six) hours as needed for moderate pain.   Yes [provider]    Allergies    Patient has no known  allergies.  Review of Systems   Review of Systems  Constitutional: Negative for fever.  Gastrointestinal: Positive for nausea and vomiting.  Neurological: Positive for dizziness and headaches. Negative for speech difficulty, weakness and numbness.  All other systems reviewed and are negative.   Physical Exam Updated Vital Signs BP 135/82 (BP Location: Left Arm)   Pulse 63   Temp 98.1 F (36.7 C) (Oral)   Resp 18   SpO2 100%   Physical Exam Vitals and nursing note reviewed.  Constitutional:      Appearance: He is well-developed.  HENT:     Head: Normocephalic and atraumatic.  Eyes:     Conjunctiva/sclera: Conjunctivae normal.  Cardiovascular:     Rate and Rhythm: Normal rate and regular rhythm.  Pulmonary:     Effort: Pulmonary effort is normal. No respiratory distress.     Breath sounds: Normal breath sounds.  Abdominal:     General: Bowel sounds are normal.     Palpations: Abdomen is soft.     Tenderness: There is no abdominal tenderness.  Skin:    General: Skin is warm.  Neurological:     Mental Status: He is alert.     Comments: Mental Status:  Alert, oriented, thought content appropriate, able to give a coherent  history. Speech fluent without evidence of aphasia. Able to follow 2 step commands without difficulty.  Cranial Nerves:  II:  pupils equal, round, reactive to light III,IV, VI: ptosis not present, extra-ocular motions intact bilaterally -- resting nystagmus is present V,VII: smile symmetric, facial light touch sensation equal VIII: hearing grossly normal to voice  X: uvula elevates symmetrically  XI: bilateral shoulder shrug symmetric and strong XII: midline tongue extension without fassiculations Motor:  Normal tone. 5/5 strength in upper and lower extremities bilaterally including strong and equal grip strength and dorsiflexion/plantar flexion Sensory: grossly normal in all extremities.  Gait: deferred CV: distal pulses palpable throughout  Patient  is able to turn his head from side to side without obvious significant worsening in his symptoms.  Psychiatric:        Behavior: Behavior normal.     ED Results / Procedures / Treatments   Labs (all labs ordered are listed, but only abnormal results are displayed) Labs Reviewed  DIFFERENTIAL - Abnormal; Notable for the following components:      Result Value   Neutro Abs 9.3 (*)    Lymphs Abs 0.6 (*)    All other components within normal limits  COMPREHENSIVE METABOLIC PANEL - Abnormal; Notable for the following components:   Glucose, Bld 145 (*)    All other components within normal limits  CBC  PROTIME-INR  APTT  CBG MONITORING, ED    EKG EKG Interpretation  Date/Time:  Saturday December 10 2019 12:08:43 EDT Ventricular Rate:  76 PR Interval:  140 QRS Duration: 86 QT Interval:  394 QTC Calculation: 443 R Axis:   86 Text Interpretation: Normal sinus rhythm Septal infarct , age undetermined Abnormal ECG No significant change since last tracing Confirmed by Isla Pence [phone]) on 12/10/2019 1:05:27 PM   Radiology CT HEAD WO CONTRAST  Result Date: 12/10/2019 CLINICAL DATA:  Dizziness. EXAM: CT HEAD WITHOUT CONTRAST TECHNIQUE: Contiguous axial images were obtained from the base of the skull through the vertex without intravenous contrast. COMPARISON:  None. FINDINGS: Brain: No evidence of acute infarction, hemorrhage, hydrocephalus, extra-axial collection or mass lesion/mass effect within the brain. There is a hypervascular mass versus vascular malformation in the right hand side aspect of the junction of the medulla and proximal spinal cord. The abnormality measures 1.7 x 0.9 x 0.8 cm. There is a hypoattenuated appearance of the adjacent parenchyma, which may represent edema. Vascular: No hyperdense vessel or unexpected calcification. Skull: Normal. Negative for fracture or focal lesion. Sinuses/Orbits: No acute finding. Other: None. IMPRESSION: Hypervascular mass versus  vascular malformation in the right hand side aspect of the junction of the medulla and proximal spinal cord. The abnormality measures 1.7 x 0.9 x 0.8 cm. There is a hypoattenuated appearance of the adjacent neural parenchyma, which may represent edema. Further evaluation with MRI of the brain and cervical spine with contrast is recommended. These results were called by telephone at the time of interpretation on 12/10/2019 at 1:18 pm to Dr. Isla Pence , who verbally acknowledged these results. Electronically Signed   By: Fidela Salisbury M.D.   On: 12/10/2019 13:19    Procedures Procedures (including critical care time)  Medications Ordered in ED Medications  sodium chloride flush (NS) 0.9 % injection 3 mL (3 mLs Intravenous Given 12/10/19 1333)  sodium chloride 0.9 % bolus 1,000 mL (1,000 mLs Intravenous New Bag/Given 12/10/19 1340)  meclizine (ANTIVERT) tablet 25 mg (25 mg Oral Given 12/10/19 1339)  ondansetron (ZOFRAN) injection 4 mg (4 mg Intravenous Given  12/10/19 1343)    ED Course  I have reviewed the triage vital signs and the nursing notes.  Pertinent labs & imaging results that were available during my care of the patient were reviewed by me and considered in my medical decision making (see chart for details).    MDM Rules/Calculators/A&P                          Patient presenting with sudden onset of room spinning dizziness with nausea and vomiting that began yesterday evening around 11 PM.  He noted some intermittent tingling sensation to the right hand, however no numbness or weakness in extremities.  No speech difficulties.  No history of vertigo.  No chronic medical problems.  On exam, he has resting nystagmus present.  Movement of his head does not appear to worsen his symptoms acutely.  Normal strength and sensation to all extremities, normal sensation and motor to the face.  IV fluids, meclizine, antiemetic ordered.  Blood work is ordered.  CT imaging ordered in triage  and is pending read.  CT head with concerning findings: Hypervascular mass versus vascular malformation in the right hand side aspect of the junction of the medulla and proximal spinal cord. > MRI brain and C-spine ordered per radiology recommendation.  Patient is aware of abnormal finding and need for further imaging.  Care assumed at shift change by Dr. Soyla Dryer.  Final Clinical Impression(s) / ED Diagnoses Final diagnoses:  None    Rx / DC Orders ED Discharge Orders    None       Latash Nouri, [place] N, PA-C 12/10/19 1528    Isla Pence, MD 12/10/19 1534

## 2019-12-10 NOTE — ED Provider Notes (Signed)
Physical Exam  BP (!) 147/90 (BP Location: Right Arm)    Pulse 74    Temp 97.7 F (36.5 C) (Oral)    Resp 16    SpO2 99%   Physical Exam HENT:     Mouth/Throat:     Mouth: Mucous membranes are moist.     Pharynx: Oropharynx is clear.  Eyes:     Pupils: Pupils are equal, round, and reactive to light.  Skin:    General: Skin is warm and dry.  Neurological:     Comments: PERRL, some nystagmus with right gaze.  Strength 5 out of 5 in all extremities.  Intact sensation in all extremities.  Gait is somewhat unsteady and requires assistance with ambulation.     ED Course/Procedures     Procedures  MDM  Care patient at 1500 from off going provider.  Patient presented after suffering acute onset nausea, vomiting, and vertiginous symptoms constantly since at least 11 PM last night.  CT scan had shown concern for lesion, MRI pending.  MRI remarkable for cavernoma with some thin peripheral edema.  Reevaluated patient, patient feels a little better after meclizine and Zofran with complete resolution of nausea and some ongoing vertigo.  He does still have some slight nystagmus at rest.  Informed patient of findings of cavernoma.  Consulted neurosurgery for further discussion of this diagnosis of cavernoma.  Unclear if this would suddenly be causing patient's symptoms, and if so, unclear why this cavernoma would have some surrounding edema.  Spoke with neurosurgery provider on call.  They report that it is possible that this cavernoma is responsible for this patient's symptoms but that they do not actively manage these lesions and he should be referred to [facility] or [facility] neurosurgery for outpatient follow-up.  They also did not strongly recommend any intervention such as steroids.  Advised patient of the discussion with neurosurgery.  Patient reports that he is feeling somewhat better still after the medications he received earlier.  Patient is tolerating p.o. intake.  He would like to try  steroids after talking with the family friend who is a neurologist and he is requesting some more of the Antivert which I think is perfectly reasonable for his vertigo.  I ambulated him around his room, and he was still unsteady and required some assistance with balance.  I had a shared decision-making conversation with the patient and offered admission since he still quite unsteady, but he and his wife would very much like to try going home.  His wife reports that he will have 24/7 assistance and she will prevent him from walking or driving and will make sure that he stays safe.  Considering the patient is hemodynamically stable and his wife is promising 24/7 assistance, I do feel it is reasonable at this time since it is the patient's wish to be discharged home.  Very strict return precautions provided.  Patient is not certain who he would like to follow-up with, so disc with his imaging provided to take to whoever he follows up with.  Advised patient of concern for cavernoma causing his symptoms. Advised treatment of symptoms with prednisone and Antivert, which were prescribed, increase fluid intake, rest. Recommended follow-up with PCP in the next couple days. Strict return precautions provided. Patient discharged in stable condition.   The care and management of this patient was overseen by Dr. Ralene Bathe, ED attending, who agreed with evaluation and management.     Launa Flight, MD 12/10/19 Lurena Nida    Ralene Bathe,  Benjamine Mola, MD 12/11/19 8102

## 2019-12-10 NOTE — ED Notes (Signed)
Unable to assess VS due to pt eager to leave. Pt was alerted that primary RN was waiting on disk coming from CT and pt unwilling to wait and wanted d/c paperwork and wanted to leave. D/c instructions given and pt wheeled out of department by spouse.

## 2019-12-10 NOTE — ED Triage Notes (Signed)
Pt reports nausea and vomiting that started yesterday evening.  Reports after vomiting a few times he had severe dizziness.  Also reports R arm tingling since around 11pm last night.  Woke up with neck pain this morning but believes it is due to how he slept.  Reports severe dizziness to the point he had to crawl on his stomach to the bathroom because he couldn't tolerate even crawling on his hands and knees.  No arm drift noted.  VAN negative.

## 2020-04-12 ENCOUNTER — Encounter: Payer: Self-pay | Admitting: Neurology

## 2020-06-05 ENCOUNTER — Telehealth: Payer: Self-pay | Admitting: Neurology

## 2020-06-05 NOTE — Telephone Encounter (Signed)
Pt advise he will need to see a Primary physician

## 2020-06-05 NOTE — Telephone Encounter (Signed)
Patient just tested positive for COVID and wants to know if Dr Tomi Likens can call him some medication  Please call

## 2020-06-15 NOTE — Progress Notes (Signed)
NEUROLOGY CONSULTATION NOTE  Anthony Beasley MRN: [id number] DOB: September 26, 1970  Referring provider: Consuella Lose, MD Primary care provider: Rosine Abe, DO  Reason for consult:  Post-stroke care  Assessment/Plan:   1.  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    1.  Given the fluctuating tachycardia (possibly related to dysautonomia from the cavernoma) will refer to cardiology, Dr. Einar Gip, for further evaluation 2.  Avoid antiplatelet therapy due to increased risk of hemorrhage 3.  I advised to avoid any activity that may aggravate symptoms (strenuous exercise/heavy lifting, taking plane trips).  He is going to look into getting an O2 tank for the plane (I told him it will not likely be approved by his insurance).  However, if this is not possible, I would not fly since it did exacerbate his symptoms and I would want to avoid an ischemic event 4.  Follow up 6 months.   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  History supplemented by ED and referring provider's notes.  He is accompanied by his wife.  On 12/09/2019, he developed sudden onset of room spinning with nausea and vomiting as well as blurred/double vision and intermittent numbness and tingling involving the right upper and lower extremities.  He presented to the [facility] ED the following day where he was noted to have nystagmus but otherwise non-lateralizing exam.  CT head personally reviewed showed a right-sided medullary hypervascular mass vs vascular malformation at the junction of with the proximal spinal cord.  Follow up MRI of brain and cervical spine with and without contrast confirmed a cavernoma with some thin peripheral edema.  Neurosurgery was consulted and he was started on steroids, meclizine  and antiemetics.  He continued to experience vertigo and right sided numbness, impeding his ability to ambulate.  He also developed intractable hiccups.  He was started on gabapentin He followed up with outpatient neurosurgery who did not recommend surgical intervention as it would be high-risk given the location of the cavernoma.    He has been evaluated by neurosurgery at tertiary care center who would not favor surgery unless he has a recurrent ischemic event.  Overall, symptoms significantly improved over 3 week period.  In regards to residual symptoms, he says his right arm feels stiff and right hand always feels like it is asleep.  Sometimes he needs to concentrate in order to look and focus on objects.  He notes fluctuating heart rate at rest with systolic ranging from 70 to 120.  In April, he flew to [place].  At the end of the plane trip, he started having exacerbation of symptoms (right sided numbness/weakness and double vision), presumably due to lower oxygen levels.  He plans to go to [place] and is concerned about the plan ride as well as being in an environment at higher altitude.     PAST MEDICAL HISTORY: Past Medical History:  Diagnosis Date  . Allergy    rhinitis  . Chronic back pain     PAST SURGICAL HISTORY: Past Surgical History:  Procedure Laterality Date  . WISDOM TOOTH EXTRACTION      MEDICATIONS: Current Outpatient Medications on File Prior to Visit  Medication Sig Dispense Refill  . ibuprofen (ADVIL) 200 MG tablet Take 400 mg by mouth every 6 (six) hours as needed for moderate pain.  No current facility-administered medications on file prior to visit.    ALLERGIES: No Known Allergies  FAMILY HISTORY: Family History  Problem Relation Age of Onset  . Hypothyroidism Mother   . Cancer Paternal Grandfather        skin  . Hyperlipidemia Other     Objective:  Blood pressure 132/85, pulse (!) 112, height 6' (1.829 m), weight 183 lb 9.6 oz (83.3  kg), SpO2 98 %. General: No acute distress.  Patient appears well-groomed.   Head:  Normocephalic/atraumatic Eyes:  fundi examined but not visualized Neck: supple, no paraspinal tenderness, full range of motion Back: No paraspinal tenderness Heart: regular rate and rhythm Lungs: Clear to auscultation bilaterally. Vascular: No carotid bruits. Neurological Exam: Mental status: alert and oriented to person, place, and time, recent and remote memory intact, fund of knowledge intact, attention and concentration intact, speech fluent and not dysarthric, language intact. Cranial nerves: CN I: not tested CN II: pupils equal, round and reactive to light, visual fields intact CN III, IV, VI:  full range of motion, no nystagmus, no ptosis CN V: facial sensation intact. CN VII: upper and lower face symmetric CN VIII: hearing intact CN IX, X: gag intact, uvula midline CN XI: sternocleidomastoid and trapezius muscles intact CN XII: tongue midline Bulk & Tone: normal, no fasciculations. Motor:  muscle strength 5/5 throughout Sensation:  Pinprick, temperature and vibratory sensation intact. Deep Tendon Reflexes:  2+ throughout,  toes downgoing.   Finger to nose testing:  Without dysmetria.   Heel to shin:  Without dysmetria.   Gait:  Normal station and stride.  Romberg negative.    Thank you for allowing me to take part in the care of this patient.  Metta Clines, DO  CC:  Doe-Hyun Shawna Orleans, DO  Consuella Lose, MD

## 2020-06-18 ENCOUNTER — Other Ambulatory Visit: Payer: Self-pay

## 2020-06-18 ENCOUNTER — Ambulatory Visit (INDEPENDENT_AMBULATORY_CARE_PROVIDER_SITE_OTHER): Payer: No Typology Code available for payment source | Admitting: Neurology

## 2020-06-18 ENCOUNTER — Encounter: Payer: Self-pay | Admitting: Neurology

## 2020-06-18 VITALS — BP 132/85 | HR 112 | Ht 72.0 in | Wt 183.6 lb

## 2020-06-18 DIAGNOSIS — D496 Neoplasm of unspecified behavior of brain: Secondary | ICD-10-CM | POA: Diagnosis not present

## 2020-06-18 DIAGNOSIS — R000 Tachycardia, unspecified: Secondary | ICD-10-CM | POA: Diagnosis not present

## 2020-06-18 NOTE — Patient Instructions (Signed)
1.  Refer to Dr. Einar Gip for tachycardia

## 2020-06-28 ENCOUNTER — Telehealth: Payer: Self-pay | Admitting: Neurology

## 2020-06-29 NOTE — Telephone Encounter (Signed)
OK, we can send a prescription for portable O2

## 2020-06-29 NOTE — Telephone Encounter (Signed)
LMOVM to call the office back. Per dr.Jaffe he is unable to fill the script for O2

## 2020-06-29 NOTE — Telephone Encounter (Signed)
Patient called to check on the status of the request. °

## 2020-07-02 NOTE — Telephone Encounter (Signed)
Patient left a voice mail returning the call.

## 2020-07-03 NOTE — Telephone Encounter (Signed)
Pt has a new  PCP, daniel Jobe want let Dr.JAffe know so any notes or referral will be CC to him.  Pt advise Dr.Jaffe will be unable to fill the portable 02

## 2020-07-12 ENCOUNTER — Other Ambulatory Visit: Payer: Self-pay

## 2020-07-12 ENCOUNTER — Inpatient Hospital Stay: Payer: No Typology Code available for payment source

## 2020-07-12 ENCOUNTER — Encounter: Payer: Self-pay | Admitting: Cardiology

## 2020-07-12 ENCOUNTER — Ambulatory Visit: Payer: No Typology Code available for payment source | Admitting: Cardiology

## 2020-07-12 VITALS — BP 147/86 | HR 77 | Temp 98.2°F | Resp 16 | Ht 72.0 in | Wt 185.2 lb

## 2020-07-12 DIAGNOSIS — R000 Tachycardia, unspecified: Secondary | ICD-10-CM

## 2020-07-12 DIAGNOSIS — Z8673 Personal history of transient ischemic attack (TIA), and cerebral infarction without residual deficits: Secondary | ICD-10-CM

## 2020-07-12 DIAGNOSIS — Z87891 Personal history of nicotine dependence: Secondary | ICD-10-CM

## 2020-07-12 DIAGNOSIS — Z8616 Personal history of COVID-19: Secondary | ICD-10-CM

## 2020-07-12 NOTE — Progress Notes (Addendum)
Date:  07/12/2020   ID:  Anthony Beasley, DOB [birth year], MRN [id number]  PCP:  Lilian Coma., MD  Cardiologist:  Rex Kras, DO, [facility] (established care 07/12/2020)  REASON FOR CONSULT: Tachycardia  REQUESTING PHYSICIAN:  Pieter Partridge, DO [address],  [place] [id number]  Chief Complaint  Patient presents with  . Tachycardia  . New Patient (Initial Visit)  . Stroke     Hx of stroke October 2021     HPI  Anthony Beasley is a 50 y.o. 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   He is referred to the office at the request of Pieter Partridge, DO for evaluation of tachycardia.  Pleasant gentleman who was in his usual state of health until October 2021.  On October 29 he developed sudden onset of room spinning associated with nausea vomiting and blurred/double vision.  He presented to [facility] the following day and was worked up by multidisciplinary team and was found to have medullary cavernoma with brainstem syndrome.  Has been followed up with neurology and neurosurgery no surgical intervention has been performed due to its location.  Now referred to cardiology for evaluation of tachycardia.  Patient states that recently he has been noticing an elevated pulse at rest and when he checks it it is usually around 100 bpm.  It is shortly limited and he is asymptomatic.  However it is still concerning and would like to be further evaluated.  He is also noticed that his blood pressures are elevated at times and they are usually around 140 mmHg as well.  He denies any chest pain or shortness of breath at rest or with effort related activities.  He remains physically active by working out on a treadmill at least 30 minutes a day 3-4 times a week.  He breaks his workout  up in segments to prevent hypoxia.  No prior history of cardiovascular disease.  No family history of premature CAD or sudden cardiac death.  And no change in overall functional status.  FUNCTIONAL STATUS: Treadmill 57mn a day  3-4x per week.    ALLERGIES: No Known Allergies  MEDICATION LIST PRIOR TO VISIT: No outpatient medications have been marked as taking for the 07/12/20 encounter (Office Visit) with TRex Kras DO.     PAST MEDICAL HISTORY: Past Medical History:  Diagnosis Date  . Allergy    rhinitis  . Chronic back pain   . History of COVID-19   . Stroke ([facility]    Medullary cavernoma    PAST SURGICAL HISTORY: Past Surgical History:  Procedure Laterality Date  . WISDOM TOOTH EXTRACTION      FAMILY HISTORY: The patient family history includes Atrial fibrillation in his father; Cancer in his paternal grandfather; Hyperlipidemia in an other family member; Hypothyroidism in his mother.  SOCIAL HISTORY:  The patient  reports that he has quit smoking. His smoking use included cigarettes. He smoked 0.50 packs per day. He has never used smokeless tobacco. He reports current alcohol use of about 1.0 standard drink of alcohol per week. He reports previous drug use. Drug: Marijuana.  REVIEW OF SYSTEMS: Review of Systems  Constitutional: Negative for chills and fever.  HENT: Negative for hoarse voice and nosebleeds.   Eyes: Negative for discharge, double vision and pain.  Cardiovascular:  Negative for chest pain, claudication, dyspnea on exertion, leg swelling, near-syncope, orthopnea, palpitations, paroxysmal nocturnal dyspnea and syncope.       Elevated HR  Respiratory: Negative for hemoptysis and shortness of breath.   Musculoskeletal: Negative for muscle cramps and myalgias.  Gastrointestinal: Negative for abdominal pain, constipation, diarrhea, hematemesis, hematochezia, melena, nausea and vomiting.  Neurological: Positive for focal weakness (stiffness right arm. ) and  paresthesias (right arm ). Negative for dizziness and light-headedness.    PHYSICAL EXAM: Vitals with BMI 07/12/2020 06/18/2020 12/10/2019  Height $Remov'6\' 0"'bMntao$  $Remove'6\' 0"'wNmECkf$  -  Weight 185 lbs 3 oz 183 lbs 10 oz -  BMI 28.41 32.4 -  Systolic 401 027 253  Diastolic 86 85 90  Pulse 77 112 -    CONSTITUTIONAL: Well-developed and well-nourished. No acute distress.  SKIN: Skin is warm and dry. No rash noted. No cyanosis. No pallor. No jaundice HEAD: Normocephalic and atraumatic.  EYES: No scleral icterus MOUTH/THROAT: Moist oral membranes.  NECK: No JVD present. No thyromegaly noted. No carotid bruits  LYMPHATIC: No visible cervical adenopathy.  CHEST Normal respiratory effort. No intercostal retractions  LUNGS: Clear to auscultation bilaterally.  No stridor. No wheezes. No rales.  CARDIOVASCULAR: Regular, positive S1-S2, no murmurs rubs or gallops appreciated. ABDOMINAL: Nonobese, soft, nontender, nondistended, positive bowel sounds in all 4 quadrants no apparent ascites.  EXTREMITIES: No peripheral edema, 2+ dorsalis pedis and posterior tibial pulses HEMATOLOGIC: No significant bruising NEUROLOGIC: Oriented to person, place, and time. Nonfocal. Normal muscle tone.  PSYCHIATRIC: Normal mood and affect. Normal behavior. Cooperative  RADIOLOGY: MRI brain with and without contrast 12/10/2019. 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 No acute intracranial hemorrhage or infarct. Multilevel spondylosis as detailed above.  CARDIAC DATABASE: EKG: 07/12/2020: Sinus bradycardia, 56 bpm, normal axis, consider old anteroseptal infarct, poor R wave progression, without underlying ischemia or injury pattern.  Echocardiogram: No results found for this or any previous visit from the past 1095 days.   Stress Testing: No results found for this or any previous visit from the past 1095 days.  Heart Catheterization: None  LABORATORY  DATA: CBC Latest Ref Rng & Units 12/10/2019  WBC 4.0 - 10.5 K/uL 10.2  Hemoglobin 13.0 - 17.0 g/dL 16.1  Hematocrit 39.0 - 52.0 % 46.9  Platelets 150 - 400 K/uL 248    CMP Latest Ref Rng & Units 12/10/2019 08/29/2010  Glucose 70 - 99 mg/dL 145(H) 79  BUN 6 - 20 mg/dL 14 -  Creatinine 0.61 - 1.24 mg/dL 0.81 -  Sodium 135 - 145 mmol/L 137 -  Potassium 3.5 - 5.1 mmol/L 3.7 -  Chloride 98 - 111 mmol/L 100 -  CO2 22 - 32 mmol/L 24 -  Calcium 8.9 - 10.3 mg/dL 9.5 -  Total Protein 6.5 - 8.1 g/dL 7.4 -  Total Bilirubin 0.3 - 1.2 mg/dL 1.0 -  Alkaline Phos 38 - 126 U/L 58 -  AST 15 - 41 U/L 26 -  ALT 0 - 44 U/L 24 -    Lipid Panel     Component Value Date/Time   CHOL 164 08/29/2010 0910   TRIG 46 08/29/2010 0910   HDL 56 08/29/2010 0910   CHOLHDL 2.9 08/29/2010 0910   VLDL 9 08/29/2010 0910   LDLCALC 99 08/29/2010 0910    No components found for: NTPROBNP No results for input(s): PROBNP in the last 8760 hours. No results for input(s): TSH in the last 8760 hours.  BMP  Recent Labs    12/10/19 1208  NA 137  K 3.7  CL 100  CO2 24  GLUCOSE 145*  BUN 14  CREATININE 0.81  CALCIUM 9.5  GFRNONAA >60    HEMOGLOBIN A1C No results found for: HGBA1C, MPG  External Labs: Collected: 06/29/2020, Care Everywhere [facility] Creatinine 1.11 mg/dL. eGFR: 81 mL/min per 1.73 m Lipid profile: Total cholesterol 196, triglycerides 70, HDL 56, LDL 127 Hemoglobin A1c: 5.2   IMPRESSION:    ICD-10-CM   1. Tachycardia  R00.0 EKG 12-Lead    PCV ECHOCARDIOGRAM COMPLETE    LONG TERM MONITOR (3-14 DAYS)    TSH  2. Hx of stroke - Medullary cavernoma  Z86.73   3. Former smoker  Z87.891   4. History of COVID-19  Z86.16      RECOMMENDATIONS: Arth Nicastro is a 50 y.o. 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   Patient has been experiencing episodes of elevated  pulse/tachycardia even at rest.  Patient has noted good chronotropic competence with his daily exercise routine.  He does not have any symptoms of near syncope or syncope. I suspect that his underlying tachycardia may be due to dysautonomia given his underlying cavernoma affecting the brainstem.  However we will proceed with a cardiac work-up to rule out any other organic causes. Plan 14-day extended Holter monitor to evaluate for dysrhythmias. Echocardiogram will be ordered to evaluate for structural heart disease and left ventricular systolic function. Check TSH Outside labs independently reviewed via Care Everywhere.  Patient's most recent lipid profile from May 2022 reviewed patient's LDL is 127 mg/dL.  Given his history of medullary cavernoma causing acute brainstem syndrome recommend better lipid management.  Currently not on statin therapy.  His estimated 10-year risk of ASCVD is 3.8%.  Would recommend aggressive lifestyle modifications which includes dietary restrictions of cholesterol rich foods.  Follow-up lipid profile recommended.  Patient states that he will follow-up with PCP.  Patient's blood pressure at today's office visit is not ideal.  I have asked him to keep a log of his blood pressures at home and to bring it in at the next office visit for when he follows up with his PCP.  I suspect that he may have a degree of benign essential hypertension which may need to be either followed up or treated with pharmacological therapy.  Low-salt diet recommended.  Monitor for now.  FINAL MEDICATION LIST END OF ENCOUNTER: No orders of the defined types were placed in this encounter.   Medications Discontinued During This Encounter  Medication Reason  . ibuprofen (ADVIL) 200 MG tablet Error    No current outpatient medications on file.  Orders Placed This Encounter  Procedures  . TSH  . LONG TERM MONITOR (3-14 DAYS)  . EKG 12-Lead  . PCV ECHOCARDIOGRAM COMPLETE   There are no Patient  Instructions on file for this visit.   --Continue cardiac medications as reconciled in final medication list. --Return in about 4 weeks (around 08/09/2020) for Follow up, Tachycardiac, Review test results. Or sooner if needed. --Continue follow-up with your primary care physician regarding the management of your other chronic comorbid conditions.  Patient's questions and concerns were addressed to his satisfaction. He voices understanding of the instructions provided during this encounter.   This note was created using a voice recognition software as a result there may be grammatical errors inadvertently enclosed that do not reflect the nature  of this encounter. Every attempt is made to correct such errors.  Rex Kras, [place], [facility]  Pager: [phone] Office: [phone]

## 2020-07-16 LAB — COLOGUARD: COLOGUARD: POSITIVE — AB

## 2020-07-18 ENCOUNTER — Telehealth: Payer: Self-pay | Admitting: Neurology

## 2020-07-18 NOTE — Telephone Encounter (Signed)
Patient wants to see if Dr. Tomi Likens is okay for him to have a colonoscopy and have propofol with his current neurological symptoms.

## 2020-07-19 NOTE — Telephone Encounter (Signed)
Please advise 

## 2020-07-24 ENCOUNTER — Other Ambulatory Visit: Payer: Self-pay

## 2020-07-24 ENCOUNTER — Inpatient Hospital Stay: Payer: No Typology Code available for payment source

## 2020-07-24 ENCOUNTER — Ambulatory Visit: Payer: No Typology Code available for payment source

## 2020-07-24 DIAGNOSIS — R000 Tachycardia, unspecified: Secondary | ICD-10-CM

## 2020-07-25 NOTE — Telephone Encounter (Signed)
Called patient and appoliguized for the delay in [place] calling. Informed patient that per Dr. Tomi Likens: "I don't think there is any contraindication.  However, with any procedures with anesthesia, he may have a transient recurrence of symptoms (not that he will) but this isn't a contraindication."  Patient verbalized understanding and had no further questions or concerns.

## 2020-08-06 NOTE — Progress Notes (Signed)
Tried calling patient again no answer and unable to leave vm

## 2020-08-06 NOTE — Progress Notes (Signed)
Attempted to call pt, no answer. Unable to leave vm.

## 2020-08-06 NOTE — Progress Notes (Signed)
Called patient, NA, LMAM

## 2020-08-07 NOTE — Progress Notes (Signed)
Third attempt o call pt. No answer. Unable to leave VM.

## 2020-08-21 ENCOUNTER — Ambulatory Visit: Payer: No Typology Code available for payment source | Admitting: Cardiology

## 2020-08-21 ENCOUNTER — Encounter: Payer: Self-pay | Admitting: Cardiology

## 2020-08-21 ENCOUNTER — Other Ambulatory Visit: Payer: Self-pay

## 2020-08-21 VITALS — BP 144/91 | HR 80 | Temp 98.0°F | Resp 16 | Ht 72.0 in | Wt 187.6 lb

## 2020-08-21 DIAGNOSIS — Z712 Person consulting for explanation of examination or test findings: Secondary | ICD-10-CM

## 2020-08-21 DIAGNOSIS — Z87891 Personal history of nicotine dependence: Secondary | ICD-10-CM

## 2020-08-21 DIAGNOSIS — Z8616 Personal history of COVID-19: Secondary | ICD-10-CM

## 2020-08-21 DIAGNOSIS — R000 Tachycardia, unspecified: Secondary | ICD-10-CM

## 2020-08-21 DIAGNOSIS — Z8673 Personal history of transient ischemic attack (TIA), and cerebral infarction without residual deficits: Secondary | ICD-10-CM

## 2020-08-21 NOTE — Progress Notes (Signed)
ID:  Anthony Beasley, DOB 06-05-1970, MRN [id number]  PCP:  Malka So., MD  Cardiologist:  Tessa Lerner, DO, [facility] (established care 07/12/2020)  Date: 08/21/20 Last Office Visit: 07/12/2020  Chief Complaint  Patient presents with   Tachycardia   Results   Follow-up    HPI  Anthony Beasley is a 50 y.o. 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   He is referred to the office at the request of Malka So., MD for evaluation of tachycardia.  Patient is accompanied with his wife at today's office visit.  In October 2021 he was in the usual state of his health until he developed sudden onset of room spinning associated with nausea vomiting and blurred/double vision.  He presented to [facility] and was evaluated by multidisciplinary team and diagnosed with medullary cavernoma with brainstem syndrome.  He now follows with neurology and neurosurgery and has been referred to cardiology for evaluation of tachycardia.  Patient reports having an elevated heart rate at rest of around 100 bpm.  These episodes are short-lived and is asymptomatic.  However he still concerned and presents to the office for further evaluation and management.   Given his underlying cavernoma patient is unable to tolerate episodes of hypoxia whether it be due to higher elevations or high intensity workouts.  He continues to exercise on a regular basis by walking/running on a treadmill 30 minutes a day 3-4 times a week.  At the last office visit the shared decision was to proceed with an echocardiogram to evaluate for structural heart disease and also to have a Holter monitor to evaluate for underlying dysrhythmias.  Results of both were discussed with him and his wife in great detail  including the rhythm strips with a Holter monitor at today's visit.  Patient's blood pressure has noted to be elevated in the office visits and therefore was asked to keep a log of it to reevaluate if he needs pharmacological therapy.  Based on his blood pressure log that he presents at today's office visit his systolic blood pressures are running between 125-137 mmHg.   No prior history of cardiovascular disease.  No family history of premature CAD or sudden cardiac death.  And no change in overall functional status.  FUNCTIONAL STATUS: Treadmill a day  3-4x per week.    ALLERGIES: No Known Allergies  MEDICATION LIST PRIOR TO VISIT: No outpatient medications have been marked as taking for the 08/21/20 encounter (Office Visit) with Odis Hollingshead, Verneda Hollopeter, DO.     PAST MEDICAL HISTORY: Past Medical History:  Diagnosis Date   Allergy    rhinitis   Chronic back pain    History of COVID-19    Stroke (HCC)    Medullary cavernoma    PAST SURGICAL HISTORY: Past Surgical History:  Procedure Laterality Date   WISDOM TOOTH EXTRACTION      FAMILY HISTORY: The patient family history includes Atrial fibrillation in his father; Cancer in his paternal grandfather; Hyperlipidemia in an other family member; Hypothyroidism in his mother.  SOCIAL HISTORY:  The patient  reports that he has quit smoking. His smoking use included cigarettes. He smoked an average of 0.50 packs per day. He has never used smokeless tobacco. He reports current alcohol use of about 1.0 standard drink of alcohol per week.  He reports previous drug use. Drug: Marijuana.  REVIEW OF SYSTEMS: Review of Systems  Constitutional: Negative for chills and fever.  HENT:  Negative for hoarse voice and nosebleeds.   Eyes:  Negative for discharge, double vision and pain.  Cardiovascular:  Negative for chest pain, claudication, dyspnea on exertion, leg swelling, near-syncope, orthopnea, palpitations, paroxysmal nocturnal dyspnea and  syncope.  Respiratory:  Negative for hemoptysis and shortness of breath.   Musculoskeletal:  Negative for muscle cramps and myalgias.  Gastrointestinal:  Negative for abdominal pain, constipation, diarrhea, hematemesis, hematochezia, melena, nausea and vomiting.  Neurological:  Positive for focal weakness (stiffness right arm. ) and paresthesias (right arm ). Negative for dizziness and light-headedness.   PHYSICAL EXAM: Vitals with BMI 08/21/2020 08/21/2020 07/12/2020  Height - $Remove'6\' 0"'KYuWptc$  $RemoveB'6\' 0"'nxoqrlka$   Weight - 187 lbs 10 oz 185 lbs 3 oz  BMI - 96.29 52.84  Systolic 132 440 102  Diastolic 91 96 86  Pulse 80 93 77    CONSTITUTIONAL: Well-developed and well-nourished. No acute distress.  SKIN: Skin is warm and dry. No rash noted. No cyanosis. No pallor. No jaundice HEAD: Normocephalic and atraumatic.  EYES: No scleral icterus MOUTH/THROAT: Moist oral membranes.  NECK: No JVD present. No thyromegaly noted. No carotid bruits  LYMPHATIC: No visible cervical adenopathy.  CHEST Normal respiratory effort. No intercostal retractions  LUNGS: Clear to auscultation bilaterally.  No stridor. No wheezes. No rales.  CARDIOVASCULAR: Regular, positive S1-S2, no murmurs rubs or gallops appreciated. ABDOMINAL: Nonobese, soft, nontender, nondistended, positive bowel sounds in all 4 quadrants no apparent ascites.  EXTREMITIES: No peripheral edema, 2+ dorsalis pedis and posterior tibial pulses HEMATOLOGIC: No significant bruising NEUROLOGIC: Oriented to person, place, and time. Nonfocal. Normal muscle tone.  PSYCHIATRIC: Normal mood and affect. Normal behavior. Cooperative  RADIOLOGY: MRI brain with and without contrast 12/10/2019. 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 No acute intracranial hemorrhage or infarct. Multilevel spondylosis as detailed above.  CARDIAC DATABASE: EKG: 07/12/2020: Sinus bradycardia, 56 bpm, normal axis, consider  old anteroseptal infarct, poor R wave progression, without underlying ischemia or injury pattern.  Echocardiogram: 07/24/2020:  Left ventricle cavity is normal in size and wall thickness. Normal global wall motion. Normal LV systolic function with EF 61%. Normal diastolic filling pattern.  Mild (Grade I) mitral regurgitation.  Mild pulmonic regurgitation.  No evidence of pulmonary hypertension.   Stress Testing: No results found for this or any previous visit from the past 1095 days.  Heart Catheterization: None  14 day extended Holter monitor: Patch Wear Time:  12 days and 18 hours Dominant rhythm normal sinus. Heart rate 48-179 bpm.  Avg HR 81 bpm. No atrial fibrillation, ventricular tachycardia, high grade AV block, pauses (3 seconds or longer). Total ventricular ectopic burden <1%. Total supraventricular ectopic burden <1%. 1 episode of supraventricular tachycardia, asymptomatic, 6 beats in duration, heart rate ranges between 102-179. Patient triggered events: 22.  Underlying rhythm predominantly normal sinus followed by sinus tachycardia with baseline artifact.  LABORATORY DATA: CBC Latest Ref Rng & Units 12/10/2019  WBC 4.0 - 10.5 K/uL 10.2  Hemoglobin 13.0 - 17.0 g/dL 16.1  Hematocrit 39.0 - 52.0 % 46.9  Platelets 150 - 400 K/uL 248    CMP Latest Ref Rng & Units 12/10/2019 08/29/2010  Glucose 70 - 99 mg/dL 145(H) 79  BUN 6 - 20 mg/dL 14 -  Creatinine 0.61 - 1.24 mg/dL 0.81 -  Sodium 135 - 145 mmol/L 137 -  Potassium 3.5 - 5.1 mmol/L 3.7 -  Chloride 98 - 111 mmol/L 100 -  CO2 22 - 32 mmol/L 24 -  Calcium 8.9 - 10.3 mg/dL 9.5 -  Total Protein 6.5 - 8.1 g/dL 7.4 -  Total Bilirubin 0.3 - 1.2 mg/dL 1.0 -  Alkaline Phos 38 - 126 U/L 58 -  AST 15 - 41 U/L 26 -  ALT 0 - 44 U/L 24 -    Lipid Panel     Component Value Date/Time   CHOL 164 08/29/2010 0910   TRIG 46 08/29/2010 0910   HDL 56 08/29/2010 0910   CHOLHDL 2.9 08/29/2010 0910   VLDL 9 08/29/2010 0910    LDLCALC 99 08/29/2010 0910    No components found for: NTPROBNP No results for input(s): PROBNP in the last 8760 hours. No results for input(s): TSH in the last 8760 hours.  BMP Recent Labs    12/10/19 1208  NA 137  K 3.7  CL 100  CO2 24  GLUCOSE 145*  BUN 14  CREATININE 0.81  CALCIUM 9.5  GFRNONAA >60    HEMOGLOBIN A1C No results found for: HGBA1C, MPG  External Labs: Collected: 06/29/2020, Care Everywhere [facility] Creatinine 1.11 mg/dL. eGFR: 81 mL/min per 1.73 m Lipid profile: Total cholesterol 196, triglycerides 70, HDL 56, LDL 127 Hemoglobin A1c: 5.2   IMPRESSION:    ICD-10-CM   1. Tachycardia  R00.0     2. Hx of stroke - Medullary cavernoma  Z86.73     3. Former smoker  Z87.891     4. History of COVID-19  Z86.16     5. Encounter to discuss test results  Z71.2        RECOMMENDATIONS: Hosam Mcfetridge is a 50 y.o. 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   Tachycardia: Asymptomatic Underwent a 14-day extended Holter monitor with an average heart rate of 81 bpm. No evidence of NSVT, pauses, or atrial fibrillation during the monitoring period. He had 22 patient triggered events and the underlying rhythm was normal sinus followed by sinus tachycardia with baseline artifact. TSH within normal limits. Echo notes preserved LVEF will mild to mild valvular heart disease. We discussed considering initiation of AV nodal blocking agents such as beta-blockers or calcium channel blockers if he is truly symptomatic given his brief episodic events of tachycardia.  However, the shared decision was to monitor him as its not affecting his activities of daily living. I suspect that the brief episodes of tachycardia may also be associated with dysautonomia given his underlying cavernoma. Will continue to monitor  Patient's office blood pressures have consistently  been greater than 140 mmHg.  However since last visit he is kept a log of his blood pressures at home and the blood pressures appear to be within acceptable range.  I would not recommend antihypertensive medications at this time; however, I have discussed with the patient and his wife to review his blood pressure readings with his neurology/neurosurgery to see if they are within acceptable range given his underlying cavernoma and brainstem syndrome.  Patient's last lipid profile in May 2022 available in [facility] notes an LDL of 127 mg/dL.  His estimated 10-year ASCVD risk score is approximately 3.8% and therefore educated him on the importance of lifestyle changes to help improve his lipid profile.  He will be following this long-term with his PCP.  I would like to see the patient  on an annual basis or sooner if change in clinical status.  Both the patient and his wife were thankful for the work-up provided and a detailed explanation of the results.  --Continue cardiac medications as reconciled in final medication list. --Return in about 1 year (around 08/21/2021) for Follow up tachycardia. . Or sooner if needed. --Continue follow-up with your primary care physician regarding the management of your other chronic comorbid conditions.  Patient's questions and concerns were addressed to his satisfaction. He voices understanding of the instructions provided during this encounter.   This note was created using a voice recognition software as a result there may be grammatical errors inadvertently enclosed that do not reflect the nature of this encounter. Every attempt is made to correct such errors.  Rex Kras, [place], [facility]  Pager: [phone] Office: [phone]

## 2020-12-19 NOTE — Progress Notes (Signed)
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  He is accompanied by his wife.  UPDATE: Due to fluctuating tachycardia, he was evaluated by cardiology.  14 day ZIO patch and echocardiogram unremarkable.  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  He got a Conservation officer, nature which he used on the plane and when he was in [place].  He did okay.  Sometimes, if he hasn't slept well, he may notice some stiffness in the right hand/arm.  Tachycardic episodes have resolved.  Visual disturbance has resolved.    HISTORY: On 12/09/2019, he developed sudden onset of room spinning with nausea and vomiting as well as blurred/double vision and intermittent numbness and tingling involving the right upper and lower extremities.  He presented to the [facility] ED the following day where he was noted to have nystagmus but otherwise non-lateralizing exam.  CT head personally reviewed showed a right-sided medullary hypervascular mass vs vascular malformation at the junction of with the proximal spinal cord.  Follow up MRI of brain and cervical spine with and without contrast confirmed a cavernoma with some thin peripheral edema.  Neurosurgery was consulted and he was started on steroids, meclizine and  antiemetics.  He continued to experience vertigo and right sided numbness, impeding his ability to ambulate.  He also developed intractable hiccups.  He was started on gabapentin He followed up with outpatient neurosurgery who did not recommend surgical intervention as it would be high-risk given the location of the cavernoma.    He has been evaluated by neurosurgery at tertiary care center who would not favor surgery unless he has a recurrent ischemic event.  Overall, symptoms significantly improved over 3 week period.  In regards to residual symptoms, he says his right arm feels stiff and right hand always feels like it is asleep.  Sometimes he needs to concentrate in order to look and focus on objects.  He notes fluctuating heart rate at rest with systolic ranging from 70 to 120.  In April, he flew to [place].  At the end of the plane trip, he started having exacerbation of symptoms (right sided numbness/weakness and double vision), presumably due to lower oxygen levels.  He plans to go to [place] and is concerned about the plan ride as well as being in an environment at higher altitude.    PAST MEDICAL HISTORY: Past Medical History:  Diagnosis Date   Allergy    rhinitis   Chronic back pain    History of COVID-19    Stroke [facility])    Medullary cavernoma    MEDICATIONS: No current outpatient medications on file prior to visit.   No current facility-administered medications on file prior to visit.    ALLERGIES: No Known Allergies  FAMILY HISTORY: Family History  Problem Relation Age of Onset   Hypothyroidism Mother    Atrial fibrillation Father    Cancer  Paternal Grandfather        skin   Hyperlipidemia Other       Objective:  Blood pressure (!) 135/91, pulse 86, height 6' (1.829 m), weight 185 lb 6.4 oz (84.1 kg), SpO2 96 %. General: No acute distress.  Patient appears well-groomed.   Head:  Normocephalic/atraumatic Eyes:  Fundi examined but not visualized Neck:  supple, no paraspinal tenderness, full range of motion Heart:  Regular rate and rhythm Lungs:  Clear to auscultation bilaterally Back: No paraspinal tenderness Neurological Exam: alert and oriented to person, place, and time.  Speech fluent and not dysarthric, language intact.  CN II-XII intact. Bulk and tone normal, muscle strength 5/5 throughout.  Sensation to light touch intact.  Deep tendon reflexes 2+ throughout, toes downgoing.  Finger to nose testing intact.  Gait normal, Romberg negative.   Metta Clines, DO  CC: Tiana Loft, MD

## 2020-12-20 ENCOUNTER — Other Ambulatory Visit: Payer: Self-pay

## 2020-12-20 ENCOUNTER — Ambulatory Visit (INDEPENDENT_AMBULATORY_CARE_PROVIDER_SITE_OTHER): Payer: No Typology Code available for payment source | Admitting: Neurology

## 2020-12-20 ENCOUNTER — Encounter: Payer: Self-pay | Admitting: Neurology

## 2020-12-20 VITALS — BP 135/91 | HR 86 | Ht 72.0 in | Wt 185.4 lb

## 2020-12-20 DIAGNOSIS — D496 Neoplasm of unspecified behavior of brain: Secondary | ICD-10-CM

## 2020-12-20 DIAGNOSIS — D1800 Hemangioma unspecified site: Secondary | ICD-10-CM | POA: Diagnosis not present

## 2020-12-20 NOTE — Patient Instructions (Addendum)
Repeat MRI of brain with and without contrast now and again in one year avoid any activity that may aggravate symptoms (strenuous exercise/heavy lifting). Use the concentrator on planes and in high altitudes. Avoid antiplatelet therapy (such as aspirin and ibuprofen) to prevent increased risk of bleeding Follow up in one year (after the repeat MRI)

## 2021-01-15 ENCOUNTER — Ambulatory Visit
Admission: RE | Admit: 2021-01-15 | Discharge: 2021-01-15 | Disposition: A | Discharge status: Home / Self Care | Payer: No Typology Code available for payment source | Source: Ambulatory Visit | Attending: Neurology | Admitting: Neurology

## 2021-01-15 ENCOUNTER — Other Ambulatory Visit: Payer: Self-pay

## 2021-01-15 DIAGNOSIS — D496 Neoplasm of unspecified behavior of brain: Secondary | ICD-10-CM

## 2021-01-15 DIAGNOSIS — D1800 Hemangioma unspecified site: Secondary | ICD-10-CM

## 2021-01-15 IMAGING — MR MR HEAD WO/W CM
13 series · 48 of 48 positions shown · IV contrast (17ml multihance)
Comparison: [DATE]

CLINICAL DATA: Cavernoma of the brainstem with stroke in [DATE]. Right hand numbness.

EXAM:
MRI HEAD WITHOUT AND WITH CONTRAST
TECHNIQUE: Multiplanar, multiecho pulse sequences of the brain and surrounding
structures were obtained without and with intravenous contrast.
CONTRAST:  17mL MULTIHANCE GADOBENATE DIMEGLUMINE 529 MG/ML IV SOLN

[Series 2: T1 · sagittal · 5.0mm · 0.47mm/px · 1 of 27 slices shown]
[im 1/27]
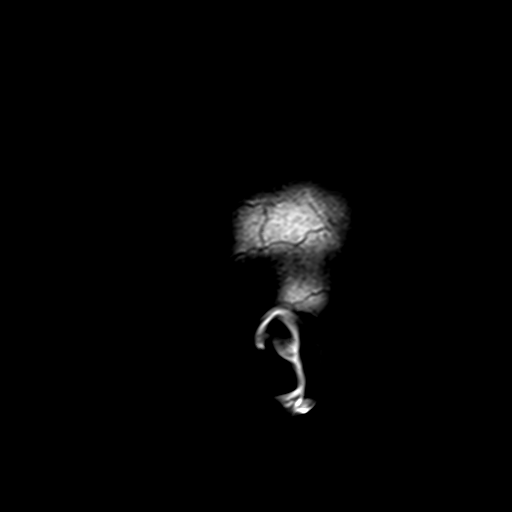

[Series 3: DWI · axial · 3.0mm · 1.80mm/px · z∈[-24,+125]mm · 5 of 103 slices shown (1 of 4)]
[im 1/103]
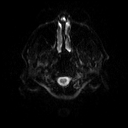
[im 26/103]
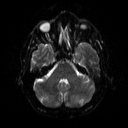
[im 52/103]
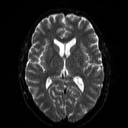
[im 77/103]
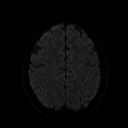
[im 103/103]
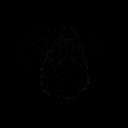

[Series 4: DWI · axial · 3.0mm · 1.80mm/px · z∈[-24,+125]mm · 3 of 51 slices shown (2 of 4)]
[im 1/51]
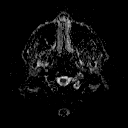
[im 26/51]
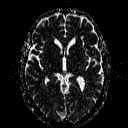
[im 51/51]
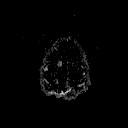

[Series 5: DWI · coronal · 5.0mm · 1.80mm/px · 5 of 84 slices shown (3 of 4)]
[im 1/84]
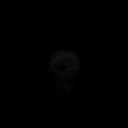
[im 21/84]
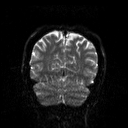
[im 42/84]
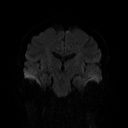
[im 63/84]
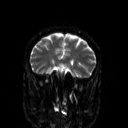
[im 84/84]
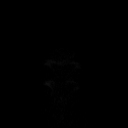

[Series 6: DWI · coronal · 5.0mm · 1.80mm/px · 2 of 42 slices shown (4 of 4)]
[im 1/42]
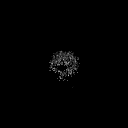
[im 42/42]
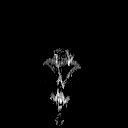

[Series 7: T2 · axial · 5.0mm · 0.60mm/px · z∈[-51,+122]mm · 2 of 27 slices shown (1 of 2)]
[im 1/27]
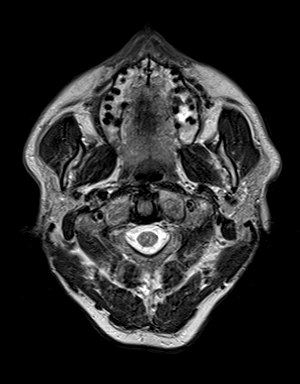
[im 27/27]
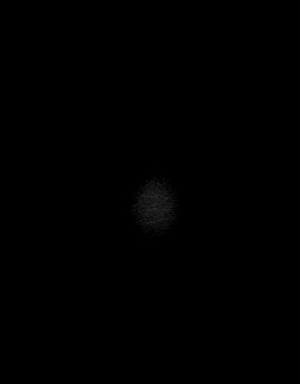

[Series 8: FLAIR · axial · 3.0mm · 0.45mm/px · z∈[-51,+123]mm · 2 of 39 slices shown]
[im 1/39]
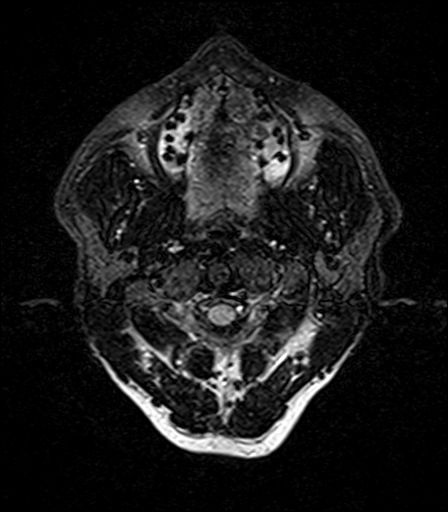
[im 39/39]
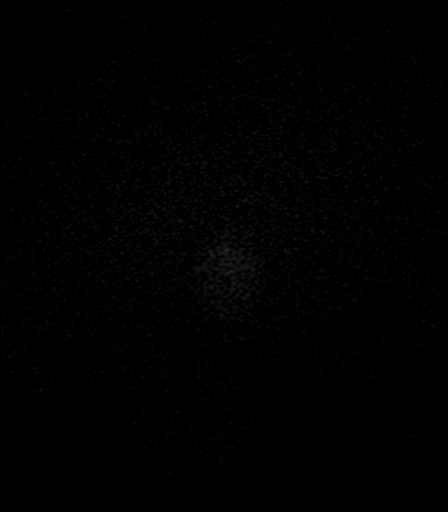

[Series 10: swi_images · axial · 4.0mm · 0.90mm/px · z∈[-45,+124]mm · 2 of 44 slices shown]
[im 1/44]
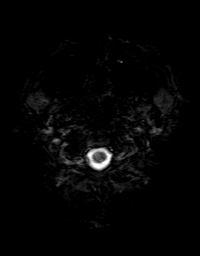
[im 44/44]
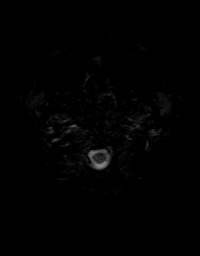

[Series 11: t1_mpr_tra · axial · 1.0mm · 0.75mm/px · z∈[-46,+126]mm · 10 of 176 slices shown]
[im 1/176]
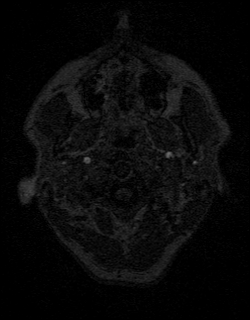
[im 20/176]
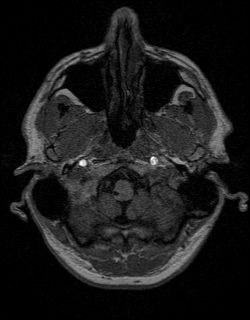
[im 39/176]
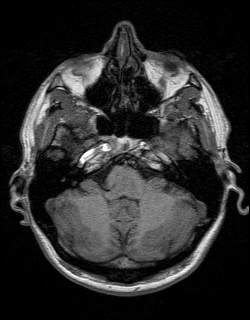
[im 59/176]
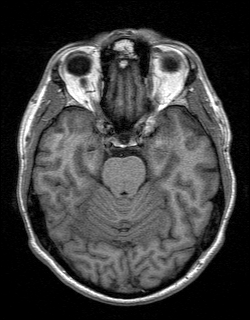
[im 78/176]
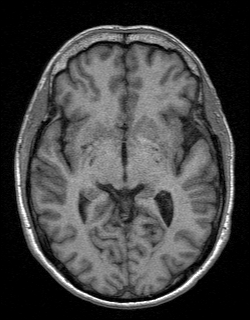
[im 98/176]
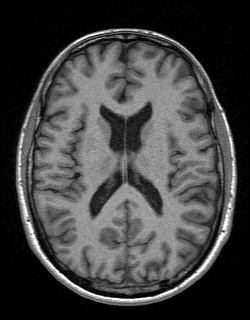
[im 117/176]
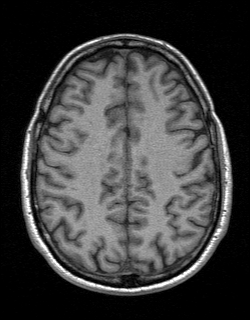
[im 137/176]
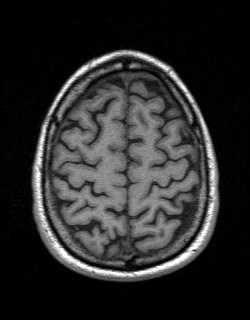
[im 156/176]
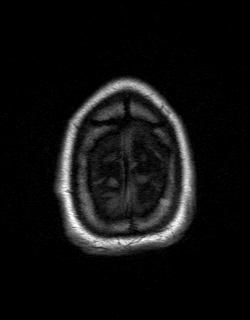
[im 176/176]
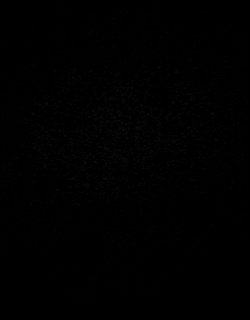

[Series 12: T2 · coronal · 5.0mm · 0.45mm/px · 2 of 33 slices shown (2 of 2)]
[im 1/33]
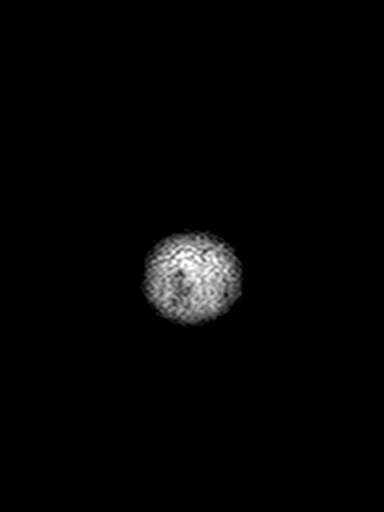
[im 33/33]
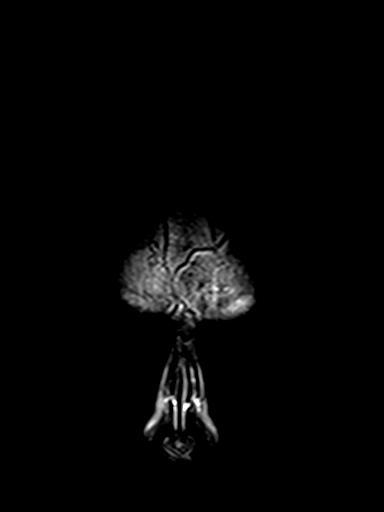

[Series 13: t1_mpr_tra post · axial · 1.0mm · 0.75mm/px · z∈[-46,+126]mm · 10 of 176 slices shown]
[im 1/176]
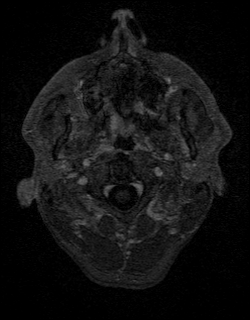
[im 20/176]
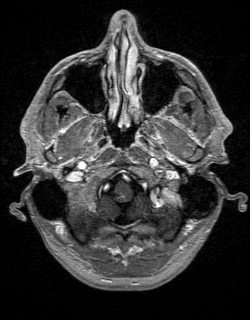
[im 39/176]
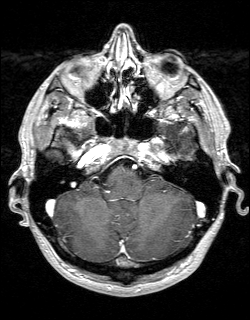
[im 59/176]
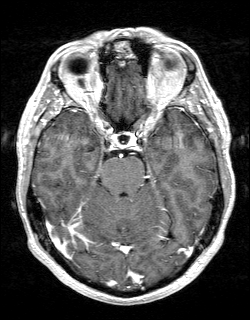
[im 78/176]
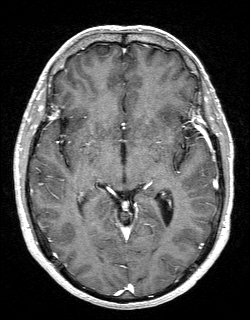
[im 98/176]
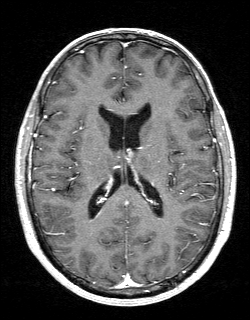
[im 117/176]
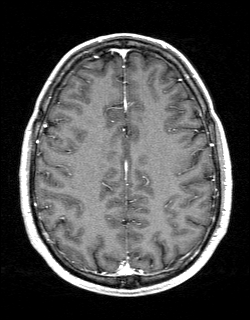
[im 137/176]
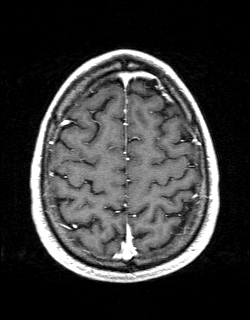
[im 156/176]
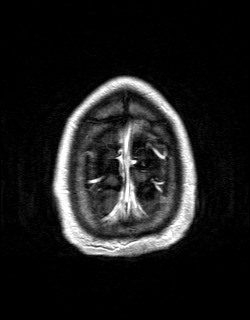
[im 176/176]
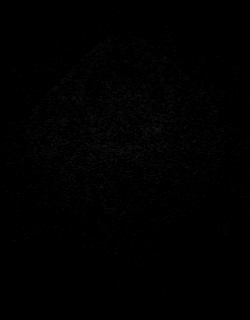

[Series 14: post cor · coronal · 5.0mm · 0.45mm/px · 2 of 33 slices shown]
[im 1/33]
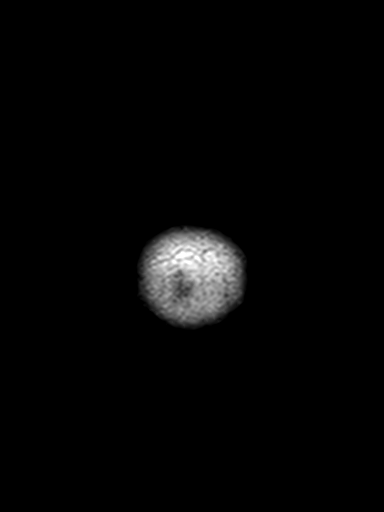
[im 33/33]
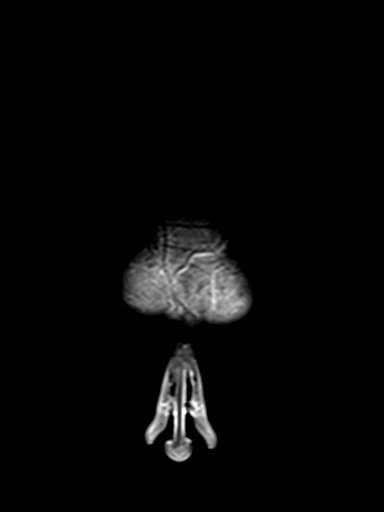

[Series 15: T1 post-contrast · sagittal · 5.0mm · 0.47mm/px · 2 of 27 slices shown]
[im 1/27]
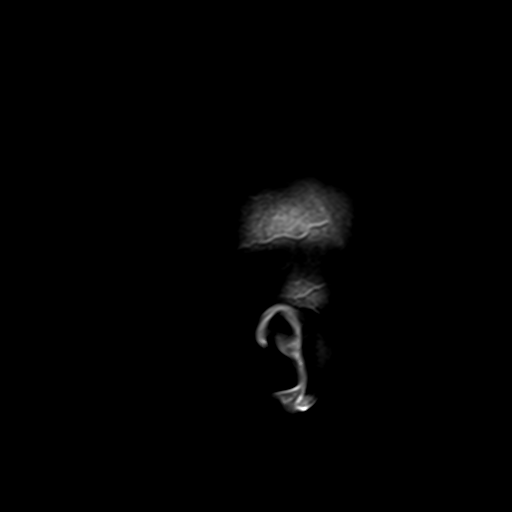
[im 27/27]
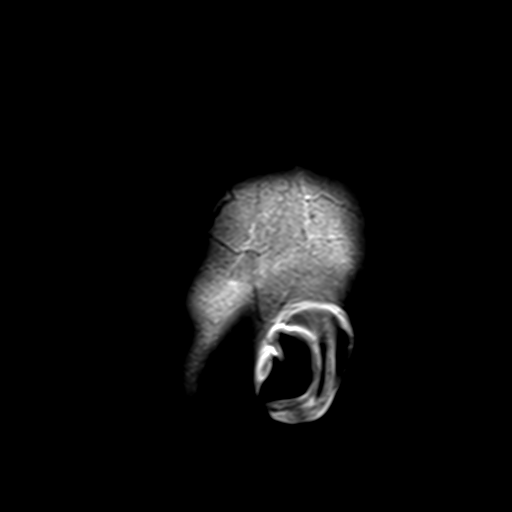

[48 of 48 positions shown; findings below may reference images not displayed]

FINDINGS: Brain: There is a focus of chronic hemosiderin within the medulla on
the right consistent with chronic cavernoma or chronic brainstem
hemorrhage. The lesion exerts less mass effect than was seen in
[DATE] in there is no surrounding brainstem edema. Small
second focus of susceptibility artifact within the inferior medulla
to the left of midline. Findings probably represent chronic sequela
of a developmental venous anomaly which could include components of
cavernoma and venous angioma. In [REDACTED] of last year, there is
probably some acute thrombosis or internal hemorrhage. No acute
features seen today. Otherwise, the brain has a normal appearance.
No evidence of other vascular abnormality. No other infarction,
mass, hydrocephalus or extra-axial collection. After contrast
administration, no abnormal enhancement occurs elsewhere. In the
medulla, there are some appreciable spidery changes of enhancement
consistent with the underlying developmental venous anomaly.

Vascular: Major vessels at the base of the brain show flow.

Skull and upper cervical spine: Normal

Sinuses/Orbits: Clear/normal

Other: None
IMPRESSION: Developmental venous anomaly of the medulla with components more
notable on the right than the left. Residual hemosiderin deposition,
but without swelling and edema that was seen in [DATE].
There was probably acute thrombosis or internal hemorrhage at that
time. The remainder the brain is normal. See above for full
discussion.

## 2021-01-15 MED ORDER — GADOBENATE DIMEGLUMINE 529 MG/ML IV SOLN
17.0000 mL | Freq: Once | INTRAVENOUS | Status: AC | PRN
  Administered 2021-01-15: 17 mL via INTRAVENOUS

## 2021-01-16 NOTE — Progress Notes (Signed)
Pt advised of his results.

## 2021-04-11 ENCOUNTER — Telehealth: Payer: Self-pay | Admitting: Neurology

## 2021-04-11 NOTE — Telephone Encounter (Signed)
Patient would like to speak with jaffe about a few things. He has an upcoming trip and has to fly.  ?

## 2021-04-11 NOTE — Telephone Encounter (Signed)
Per Dr. Georgie Chard last note: Use O2/concentrator when flying on planes or in locations of high-altitudes ? ? ?

## 2021-04-11 NOTE — Telephone Encounter (Signed)
Advised pt of Dr.Patel note below.  ?

## 2021-04-11 NOTE — Telephone Encounter (Signed)
Pt wanted to let Dr.Jaffe know he has a Work Trip to go to [place] 3/15/223.  Pt wanted to know if it okay for him to use a Oxygen Concentrator.  ? ?Please advise.  ?

## 2021-08-22 ENCOUNTER — Ambulatory Visit: Payer: No Typology Code available for payment source | Admitting: Cardiology

## 2021-08-22 ENCOUNTER — Encounter: Payer: Self-pay | Admitting: Cardiology

## 2021-08-22 VITALS — BP 139/82 | HR 72 | Temp 97.1°F | Resp 16 | Ht 72.0 in | Wt 187.6 lb

## 2021-08-22 DIAGNOSIS — Z87891 Personal history of nicotine dependence: Secondary | ICD-10-CM

## 2021-08-22 DIAGNOSIS — R000 Tachycardia, unspecified: Secondary | ICD-10-CM

## 2021-08-22 DIAGNOSIS — Z8673 Personal history of transient ischemic attack (TIA), and cerebral infarction without residual deficits: Secondary | ICD-10-CM

## 2021-08-22 DIAGNOSIS — Z8616 Personal history of COVID-19: Secondary | ICD-10-CM

## 2021-08-22 NOTE — Progress Notes (Signed)
ID:  Anthony Beasley, DOB [birth year], MRN [id number]  PCP:  Lilian Coma., MD  Cardiologist:  Rex Kras, DO, [facility] (established care 07/12/2020)  Date: 08/22/21 Last Office Visit: 08/21/2020  Chief Complaint  Patient presents with   Tachycardia   Follow-up    HPI  Anthony Beasley is a 51 y.o. male whose past medical history and cardiovascular risk factors include: Medullary cavernoma resulting in brainstem syndrome, Hx of COVID, former smoker.   In October 2021 patient had sudden onset of room spinning associated with nausea/vomiting/blurred vision and went to the hospital for further evaluation.  He was diagnosed with medullary cavernoma with brainstem syndrome.  He was later referred to cardiology for evaluation of tachycardia.  He underwent work-up for tachycardia which is essentially unremarkable.  At the last office visit his home blood pressures well controlled and his ASCVD risk was <5% and was educated importance of primary prevention.  He now presents for 1 year follow-up visit.  He is no longer experiencing episodes of tachycardia or heart racing.  No lightheaded or dizziness with effort related activities.  He recently had gone rockclimbing with his kids and did well.  He routinely either goes out for a run or goes on the treadmill for 10 minutes on a daily basis without any effort related symptoms.  At home he is noticed that his systolic blood pressures are around 135 mmHg.  Currently not on antihypertensive medications.  He has not had any recent labs for me to review.  No family history of premature CAD or sudden cardiac death.  FUNCTIONAL STATUS: Treadmill 10 minutes everyday.   ALLERGIES: No Known Allergies  MEDICATION LIST PRIOR TO VISIT: No outpatient medications have been marked as taking for the 08/22/21 encounter (Office Visit) with Terri Skains, Keilee Denman, DO.     PAST MEDICAL HISTORY: Past Medical History:  Diagnosis Date   Allergy    rhinitis   Chronic  back pain    History of COVID-19    Stroke ([place])    Medullary cavernoma    PAST SURGICAL HISTORY: Past Surgical History:  Procedure Laterality Date   WISDOM TOOTH EXTRACTION      FAMILY HISTORY: The patient family history includes Atrial fibrillation in his father; Cancer in his paternal grandfather; Hyperlipidemia in an other family member; Hypothyroidism in his mother.  SOCIAL HISTORY:  The patient  reports that he has quit smoking. His smoking use included cigarettes. He smoked an average of .5 packs per day. He has never used smokeless tobacco. He reports current alcohol use of about 1.0 standard drink of alcohol per week. He reports that he does not currently use drugs after having used the following drugs: Marijuana.  REVIEW OF SYSTEMS: Review of Systems  Constitutional: Negative for chills and fever.  HENT:  Negative for hoarse voice and nosebleeds.   Eyes:  Negative for discharge, double vision and pain.  Cardiovascular:  Negative for chest pain, claudication, dyspnea on exertion, leg swelling, near-syncope, orthopnea, palpitations, paroxysmal nocturnal dyspnea and syncope.  Respiratory:  Negative for hemoptysis and shortness of breath.   Musculoskeletal:  Negative for muscle cramps and myalgias.  Gastrointestinal:  Negative for abdominal pain, constipation, diarrhea, hematemesis, hematochezia, melena, nausea and vomiting.  Neurological:  Positive for focal weakness (stiffness right arm. ) and paresthesias (right arm ). Negative for dizziness and light-headedness.    PHYSICAL EXAM:    08/22/2021    2:18 PM 08/22/2021    2:16 PM 12/20/2020   10:22 AM  Vitals  with BMI  Height  $Remov'6\' 0"'aeriaX$  $Remove'6\' 0"'eWvyytT$   Weight  187 lbs 10 oz 185 lbs 6 oz  BMI  09.62 83.66  Systolic 294 765 465  Diastolic 82 93 91  Pulse 72 86 86    CONSTITUTIONAL: Well-developed and well-nourished. No acute distress.  SKIN: Skin is warm and dry. No rash noted. No cyanosis. No pallor. No jaundice HEAD:  Normocephalic and atraumatic.  EYES: No scleral icterus MOUTH/THROAT: Moist oral membranes.  NECK: No JVD present. No thyromegaly noted. No carotid bruits  CHEST Normal respiratory effort. No intercostal retractions  LUNGS: Clear to auscultation bilaterally.  No stridor. No wheezes. No rales.  CARDIOVASCULAR: Regular, positive S1-S2, no murmurs rubs or gallops appreciated. ABDOMINAL: Nonobese, soft, nontender, nondistended, positive bowel sounds in all 4 quadrants no apparent ascites.  EXTREMITIES: No peripheral edema, 2+ dorsalis pedis and posterior tibial pulses HEMATOLOGIC: No significant bruising NEUROLOGIC: Oriented to person, place, and time. Nonfocal. Normal muscle tone.  PSYCHIATRIC: Normal mood and affect. Normal behavior. Cooperative No change in physical examination in the last office visit.  RADIOLOGY: MRI brain with and without contrast 12/10/2019. 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 No acute intracranial hemorrhage or infarct. Multilevel spondylosis as detailed above.  CARDIAC DATABASE: EKG: 08/22/2021: Normal sinus rhythm, 80 bpm, without underlying ischemia injury pattern.  Echocardiogram: 07/24/2020:  Left ventricle cavity is normal in size and wall thickness. Normal global wall motion. Normal LV systolic function with EF 61%. Normal diastolic filling pattern.  Mild (Grade I) mitral regurgitation.  Mild pulmonic regurgitation.  No evidence of pulmonary hypertension.   Stress Testing: No results found for this or any previous visit from the past 1095 days.  Heart Catheterization: None  14 day extended Holter monitor: Patch Wear Time:  12 days and 18 hours Dominant rhythm normal sinus. Heart rate 48-179 bpm.  Avg HR 81 bpm. No atrial fibrillation, ventricular tachycardia, high grade AV block, pauses (3 seconds or longer). Total ventricular ectopic burden <1%. Total supraventricular  ectopic burden <1%. 1 episode of supraventricular tachycardia, asymptomatic, 6 beats in duration, heart rate ranges between 102-179. Patient triggered events: 22.  Underlying rhythm predominantly normal sinus followed by sinus tachycardia with baseline artifact.  LABORATORY DATA: External Labs: Collected: 06/29/2020, Care Everywhere [facility] Creatinine 1.11 mg/dL. eGFR: 81 mL/min per 1.73 m Lipid profile: Total cholesterol 196, triglycerides 70, HDL 56, LDL 127 Hemoglobin A1c: 5.2   IMPRESSION:    ICD-10-CM   1. Tachycardia  R00.0 EKG 12-Lead    2. Hx of stroke - Medullary cavernoma  Z86.73     3. Former smoker  Z87.891     4. History of COVID-19  Z86.16        RECOMMENDATIONS: Anthony Beasley is a 52 y.o. 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   Patient was initially referred to the practice for evaluation and management of tachycardia.  I suspect that the tachycardia that he was experiencing the past was likely mediated by either hypoxia or dysautonomia given his underlying cavernoma.  He presents today for 1 year follow-up visit and has remained essentially asymptomatic.  His overall functional capacity remains relatively stable when compared to a year ago.  His home blood pressures are slightly trending up.  I have asked him to keep a log of his blood pressures and if his numbers are consistently greater than 120 mmHg  to follow-up with PCP to see if pharmacological therapy is warranted.  He is also due for his annual labs which are still pending.   Educated him on the importance of primary prevention.  I would like to see him back once every 2 years or on as-needed basis.  Patient is agreeable with the plan of care.  --Continue cardiac medications as reconciled in final medication list. --Return in about 2 years (around 08/23/2023), or if symptoms worsen or  fail to improve, for Annual follow up . Or sooner if needed. --Continue follow-up with your primary care physician regarding the management of your other chronic comorbid conditions.  Patient's questions and concerns were addressed to his satisfaction. He voices understanding of the instructions provided during this encounter.   This note was created using a voice recognition software as a result there may be grammatical errors inadvertently enclosed that do not reflect the nature of this encounter. Every attempt is made to correct such errors.  Total time spent: 20 minutes  Mechele Claude [facility]  Pager: [phone] Office: [phone]

## 2022-01-20 NOTE — Progress Notes (Unsigned)
NEUROLOGY FOLLOW UP OFFICE NOTE  Anthony Beasley [id number]  Assessment/Plan:   Medullary cavernoma/developmental venous anomaly with probable thrombosis or internal hemorrhage which caused acute brainstem syndrome, still with residual symptoms (right-sided numbness, diplopia, sympathetic dysfunction with fluctuating heart rate)  avoid any activity that may aggravate symptoms (strenuous exercise/heavy lifting) Use O2/concentrator when flying on planes or in locations of high-altitudes Avoid antiplatelet therapy due to increased risk of hemorrhage Repeat MRI of brain with and without contrast now and again in one year Follow up after repeat MRI in one year.  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  He is accompanied by his wife.  UPDATE: Repeat MRI of brain with and without contrast on 01/15/2021 personally reviewed showed the developmental venous anomaly of the medulla with components more notable on the right han the left.  Residual hemosiderin deposition, but without swelling and edema that was seen in October of 2021.  There   He has a concentrator which he used on the plane and when he is in high-altitudes.  He did okay.  Sometimes, if he hasn't slept well, he may notice some stiffness in the right hand/arm.    HISTORY: On 12/09/2019, he developed sudden onset of room spinning with nausea and vomiting as well as blurred/double vision and intermittent numbness and tingling involving the right upper and lower extremities.  He presented to the [facility] ED the following day where he was noted to have nystagmus but otherwise non-lateralizing exam.  CT head showed a right-sided medullary hypervascular mass vs vascular malformation at the junction of with the proximal spinal cord.  Follow up MRI of brain and cervical spine with and without contrast confirmed a cavernoma with some thin peripheral  edema.  Neurosurgery was consulted and he was started on steroids, meclizine and antiemetics.  He continued to experience vertigo and right sided numbness, impeding his ability to ambulate.  He also developed intractable hiccups.  He was started on gabapentin He followed up with outpatient neurosurgery who did not recommend surgical intervention as it would be high-risk given the location of the cavernoma.    He has been evaluated by neurosurgery at tertiary care center who would not favor surgery unless he has a recurrent ischemic event.  Overall, symptoms significantly improved over 3 week period.  In regards to residual symptoms, he says his right arm feels stiff and right hand always feels like it is asleep.  Sometimes he needs to concentrate in order to look and focus on objects.  He notes fluctuating heart rate at rest with systolic ranging from 70 to 120.  In April 2022, he flew to [place].  At the end of the plane trip, he started having exacerbation of symptoms (right sided numbness/weakness and double vision), presumably due to lower oxygen levels.  Due to fluctuating tachycardia, he was evaluated by cardiology.  14 day ZIO patch and echocardiogram unremarkable.  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    PAST MEDICAL HISTORY: Past Medical History:  Diagnosis Date   Allergy    rhinitis   Chronic back pain    History of COVID-19    Stroke [facility])    Medullary cavernoma    MEDICATIONS: No current outpatient medications on file prior to visit.   No current facility-administered medications on file prior to visit.    ALLERGIES: No Known Allergies  FAMILY HISTORY: Family History  Problem Relation Age of Onset   Hypothyroidism Mother    Atrial fibrillation Father    Cancer Paternal Grandfather        skin   Hyperlipidemia Other       Objective:  *** General: No acute distress.  Patient appears well-groomed.   Head:  Normocephalic/atraumatic Eyes:   Fundi examined but not visualized Neck: supple, no paraspinal tenderness, full range of motion Heart:  Regular rate and rhythm Lungs:  Clear to auscultation bilaterally Back: No paraspinal tenderness Neurological Exam: ***   Metta Clines, DO  CC: Tiana Loft, MD

## 2022-01-21 ENCOUNTER — Encounter: Payer: Self-pay | Admitting: Neurology

## 2022-01-21 ENCOUNTER — Ambulatory Visit (INDEPENDENT_AMBULATORY_CARE_PROVIDER_SITE_OTHER): Payer: No Typology Code available for payment source | Admitting: Neurology

## 2022-01-21 VITALS — BP 151/92 | HR 81 | Ht 72.0 in | Wt 187.8 lb

## 2022-01-21 DIAGNOSIS — G939 Disorder of brain, unspecified: Secondary | ICD-10-CM

## 2022-01-21 DIAGNOSIS — Q283 Other malformations of cerebral vessels: Secondary | ICD-10-CM | POA: Diagnosis not present

## 2023-01-21 NOTE — Progress Notes (Unsigned)
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  He is accompanied by his wife.  UPDATE:   He has a concentrator which he may use on the plane and when he is in high-altitudes.  Over the summer, he didn't need it while on a plane and only used it once while on a week long trip in [place] where he was in high-altitudes.  Right hand sometimes feels like it is asleep but does not feel stiff.   HISTORY: On 12/09/2019, he developed sudden onset of room spinning with nausea and vomiting as well as blurred/double vision and intermittent numbness and tingling involving the right upper and lower extremities.  He presented to the [facility] ED the following day where he was noted to have nystagmus but otherwise non-lateralizing exam.  CT head showed a right-sided medullary hypervascular mass vs vascular malformation at the junction of with the proximal spinal cord.  Follow up MRI of brain and cervical spine with and without contrast confirmed a cavernoma with some thin peripheral edema.  Neurosurgery was consulted and he was started on steroids, meclizine and antiemetics.  He continued to experience vertigo and right sided numbness, impeding his ability to ambulate.  He also developed intractable hiccups.  He was started on gabapentin He  followed up with outpatient neurosurgery who did not recommend surgical intervention as it would be high-risk given the location of the cavernoma.    He has been evaluated by neurosurgery at tertiary care center who would not favor surgery unless he has a recurrent ischemic event.  Overall, symptoms significantly improved over 3 week period.  In regards to residual symptoms, he says his right arm feels stiff and right hand always feels like it is asleep.  Sometimes he needs to concentrate in order to look and focus on objects.  He notes fluctuating heart rate at rest with systolic ranging from 70 to 120.  In April 2022, he flew to [place].  At the end of the plane trip, he started having exacerbation of symptoms (right sided numbness/weakness and double vision), presumably due to lower oxygen levels.  Due to fluctuating tachycardia, he was evaluated by cardiology.  14 day ZIO patch and echocardiogram unremarkable.  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    Repeat MRI of brain with and without contrast on 01/15/2021 showed the developmental venous anomaly of the medulla with components more notable on the right han the left.  Residual hemosiderin deposition, but without swelling and edema that was seen in October of 2021.    PAST MEDICAL HISTORY: Past Medical History:  Diagnosis Date   Allergy    rhinitis   Chronic back pain    History of COVID-19    Stroke [facility])    Medullary cavernoma    MEDICATIONS: No current outpatient medications on file prior to visit.   No current facility-administered medications on file prior to visit.    ALLERGIES: No  Known Allergies  FAMILY HISTORY: Family History  Problem Relation Age of Onset   Hypothyroidism Mother    Atrial fibrillation Father    Cancer Paternal Grandfather        skin   Hyperlipidemia Other       Objective:  *** General: No acute distress.  Patient appears well-groomed.   Head:   Normocephalic/atraumatic Eyes:  Fundi examined but not visualized Neck: supple, no paraspinal tenderness, full range of motion Heart:  Regular rate and rhythm Neurological Exam: alert and oriented.  Speech fluent and not dysarthric, language intact.  CN II-XII intact. Bulk and tone normal, muscle strength 5/5 throughout.  Sensation to light touch intact.  Deep tendon reflexes 2+ throughout.  Finger to nose testing intact.  Gait normal, Romberg negative.    Shon Millet, DO  CC: Synetta Fail, MD

## 2023-01-22 ENCOUNTER — Encounter: Payer: Self-pay | Admitting: Neurology

## 2023-01-22 ENCOUNTER — Ambulatory Visit (INDEPENDENT_AMBULATORY_CARE_PROVIDER_SITE_OTHER): Payer: No Typology Code available for payment source | Admitting: Neurology

## 2023-01-22 VITALS — BP 134/87 | HR 83

## 2023-01-22 DIAGNOSIS — G939 Disorder of brain, unspecified: Secondary | ICD-10-CM

## 2023-01-22 NOTE — Patient Instructions (Signed)
Check follow up MRI of brain with and without contrast

## 2023-02-17 ENCOUNTER — Encounter: Payer: Self-pay | Admitting: Neurology

## 2023-02-20 ENCOUNTER — Ambulatory Visit
Admission: RE | Admit: 2023-02-20 | Discharge: 2023-02-20 | Disposition: A | Discharge status: Home / Self Care | Payer: No Typology Code available for payment source | Source: Ambulatory Visit | Attending: Neurology | Admitting: Neurology

## 2023-02-20 DIAGNOSIS — G939 Disorder of brain, unspecified: Secondary | ICD-10-CM

## 2023-02-20 MED ORDER — GADOPICLENOL 0.5 MMOL/ML IV SOLN: 10.0000 mL | Freq: Once | INTRAVENOUS | Status: DC | PRN

## 2023-02-20 MED ORDER — GADOPICLENOL 0.5 MMOL/ML IV SOLN
10.0000 mL | Freq: Once | INTRAVENOUS | Status: AC | PRN
  Administered 2023-02-20: 10 mL via INTRAVENOUS

## 2024-01-22 NOTE — Progress Notes (Signed)
 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  MRI personally reviewed.  UPDATE: MRI of brain with and without contrast on 03/04/2023 again demonstrated medullary cavernous venous vascular malformation with associated developmental venous anomaly, as well as multifocal chronic hemosiderin at the cervicomedullary junction with dominant lesion is in the right dorsal lateral medulla without associated edema or other complicating features, stable compared to prior imaging in 2022.  Stable.  No change.  He has not been bringing the oxygen with him on trips.  Tolerating activities like rock hiking and tolerating high altitudes.  Still has numbness in right hand and around mouth.      HISTORY: On 12/09/2019, he developed sudden onset of room spinning with nausea and vomiting as well as blurred/double vision and intermittent numbness and tingling involving the right upper and lower extremities.  He presented to the [facility] ED the following day where he was noted to have nystagmus but otherwise non-lateralizing exam.  CT head showed a right-sided medullary hypervascular mass vs vascular malformation at the junction of with the proximal spinal cord.  Follow up MRI of brain and cervical spine with and without contrast confirmed a  cavernoma with some thin peripheral edema.  Neurosurgery was consulted and he was started on steroids, meclizine  and antiemetics.  He continued to experience vertigo and right sided numbness, impeding his ability to ambulate.  He also developed intractable hiccups.  He was started on gabapentin He followed up with outpatient neurosurgery who did not recommend surgical intervention as it would be high-risk given the location of the cavernoma.    He has been evaluated by neurosurgery at tertiary care center who would not favor surgery unless he has a recurrent ischemic event.  Overall, symptoms significantly improved over 3 week period.  In regards to residual symptoms, he says his right arm feels stiff and right hand always feels like it is asleep.  Sometimes he needs to concentrate in order to look and focus on objects.  He notes fluctuating heart rate at rest with systolic ranging from 70 to 120.  In April 2022, he flew to [place].  At the end of the plane trip, he started having exacerbation of symptoms (right sided numbness/weakness and double vision), presumably due to lower oxygen levels.  Due to fluctuating tachycardia, he was evaluated by cardiology.  14 day ZIO patch and echocardiogram unremarkable.  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    Repeat MRI of brain with and without contrast on 01/15/2021 showed the developmental venous anomaly of the medulla with components more notable on the right hand the left.  Residual hemosiderin deposition, but without swelling and edema that was seen in October of 2021.    PAST MEDICAL HISTORY: Past Medical History:  Diagnosis Date   Allergy    rhinitis   Chronic back pain    History of COVID-19    Stroke [facility])    Medullary  cavernoma    MEDICATIONS: Current Outpatient Medications on File Prior to Visit  Medication Sig Dispense Refill   sildenafil (VIAGRA) 50 MG tablet Take 50 mg by mouth as needed.     No current  facility-administered medications on file prior to visit.    ALLERGIES: No Known Allergies  FAMILY HISTORY: Family History  Problem Relation Age of Onset   Hypothyroidism Mother    Atrial fibrillation Father    Cancer Paternal Grandfather        skin   Hyperlipidemia Other       Objective:  Blood pressure (!) 150/95, pulse 89, height 6' (1.829 m), weight 191 lb 3.2 oz (86.7 kg), SpO2 97%. General: No acute distress.  Patient appears well-groomed.   Head:  Normocephalic/atraumatic Neck:  Supple.  No paraspinal tenderness.  Full range of motion. Heart:  Regular rate and rhythm. Neuro:  Alert and oriented.  Speech fluent and not dysarthric.  Language intact.  CN II-XII intact.  Bulk and tone normal.  Muscle strength 5/5 throughout.  Sensation to light touch intact.  Deep tendon reflexes 2+ throughout, toes downgoing.  Gait normal.  Romberg negative.     Juliene Dunnings, DO  CC: Jon Kiang, MD

## 2024-01-25 ENCOUNTER — Encounter: Payer: Self-pay | Admitting: Neurology

## 2024-01-25 ENCOUNTER — Ambulatory Visit (INDEPENDENT_AMBULATORY_CARE_PROVIDER_SITE_OTHER): Payer: No Typology Code available for payment source | Admitting: Neurology

## 2024-01-25 VITALS — BP 150/95 | HR 89 | Ht 72.0 in | Wt 191.2 lb

## 2024-01-25 DIAGNOSIS — G939 Disorder of brain, unspecified: Secondary | ICD-10-CM

## 2025-01-24 ENCOUNTER — Ambulatory Visit: Payer: Self-pay | Admitting: Neurology
